# Patient Record
Sex: Female | Born: 1965
Health system: Southern US, Community
[De-identification: ages and names within clinical notes are randomized; demographics above are authoritative.]

## PROBLEM LIST (undated history)

## (undated) DIAGNOSIS — R011 Cardiac murmur, unspecified: Secondary | ICD-10-CM

## (undated) DIAGNOSIS — T7840XA Allergy, unspecified, initial encounter: Secondary | ICD-10-CM

## (undated) HISTORY — PX: OTHER SURGICAL HISTORY: SHX169

## (undated) HISTORY — DX: Cardiac murmur, unspecified: R01.1

## (undated) HISTORY — PX: WISDOM TOOTH EXTRACTION: SHX21

## (undated) HISTORY — DX: Allergy, unspecified, initial encounter: T78.40XA

---

## 2011-06-04 ENCOUNTER — Encounter: Payer: Self-pay | Admitting: Family Medicine

## 2011-06-04 ENCOUNTER — Ambulatory Visit (INDEPENDENT_AMBULATORY_CARE_PROVIDER_SITE_OTHER): Payer: 59 | Admitting: Family Medicine

## 2011-06-04 VITALS — BP 123/80 | HR 61 | Temp 97.9°F | Ht 67.0 in | Wt 125.0 lb

## 2011-06-04 DIAGNOSIS — M79609 Pain in unspecified limb: Secondary | ICD-10-CM

## 2011-06-04 DIAGNOSIS — M79673 Pain in unspecified foot: Secondary | ICD-10-CM

## 2011-06-04 NOTE — Patient Instructions (Signed)
Your ultrasound is negative for the usual signs of a calcaneal stress fracture (bony irregularity, blood flow and swelling on top of bone surface). This is most consistent with plantar fasciitis Take tylenol or aleve as needed for pain  Plantar fascia stretch for 20-30 seconds (do 3 of these) in morning Lowering/raise on a step exercises 3 x 15 once or twice a day - this is very important for long term recovery. Can add heel walks, toe walks forward and backward as well Ice bucket 10-15 minutes at end of day Avoid flat shoes/barefoot walking as much as possible. Arch straps have been shown to help with pain. Consider heel lifts to avoid fully stretching the plantar fascia if painful with walking. Over the counter inserts may be helpful as well. Steroid injection is a consideration for short term pain relief if you are struggling. Physical therapy is also an option. >90% improve by 12 months with or without treatment but the above things improve the condition faster. No running if pain is > 3 on scale of 1-10 or if you're limping. Cross train for at least next 2 weeks. When going back to running do a walk:jog program 1 minute: 1 minute for total of 10 minutes and do this every other day.  Increase jog time by 1-2 minutes each session and total workout time by 5 minutes. Follow up with me in 6 weeks for a recheck.

## 2011-06-05 ENCOUNTER — Encounter: Payer: Self-pay | Admitting: Family Medicine

## 2011-06-05 DIAGNOSIS — M79673 Pain in unspecified foot: Secondary | ICD-10-CM | POA: Insufficient documentation

## 2011-06-05 NOTE — Assessment & Plan Note (Signed)
Ultrasound is reassuring as well as initial evolution of her pain, negative calcaneal squeeze, and location of tenderness (at PF insertion).  Believe this represents plantar fasciitis though discussed is possible she has a stress reaction.  Discussed relative rest.  Shown home stretches/exercises.  Arch strap provided.  Icing, tylenol/motrin as needed.  No running for 2 weeks then can trial walk/jog program - no running if limping or pain > 3/10.  Cross train in meantime.  See instructions for further.

## 2011-06-05 NOTE — Progress Notes (Signed)
  Subjective:    Patient ID: Marie Avila, female    DOB: 11-13-1965, 45 y.o.   MRN: [id number]  PCP: None  HPI 45 yo F here for right heel pain.  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 States approximately 2 months ago started to develop plantar right heel pain after her runs. Also noted pain in the morning with first steps. Did not limit her running initially. Started to notice pain toward end of runs and now has pain throughout runs. No pain at rest or with walking currently. Has been doing massage, icing. No bruising or swelling. Has not done any specific rehab exercises for this. Feels it when she does a calf stretch. Took a week off then went back to running and pain was still present. No prior stress fractures.  History reviewed. No pertinent past medical history.  No current outpatient prescriptions on file prior to visit.    History reviewed. No pertinent past surgical history.  Allergies  Allergen Reactions  . Penicillins     History   Social History  . Marital Status: Single    Spouse Name: N/A    Number of Children: N/A  . Years of Education: N/A   Occupational History  . Not on file.   Social History Main Topics  . Smoking status: Never Smoker   . Smokeless tobacco: Not on file  . Alcohol Use: Not on file  . Drug Use: Not on file  . Sexually Active: Not on file   Other Topics Concern  . Not on file   Social History Narrative  . No narrative on file    Family History  Problem Relation Age of Onset  . Hypertension Maternal Grandfather   . Sudden death Neg Hx   . Hyperlipidemia Neg Hx   . Heart attack Neg Hx   . Diabetes Neg Hx     BP 123/80  Pulse 61  Temp(Src) 97.9 F (36.6 C) (Oral)  Ht 5\' 7"  (1.702 m)  Wt 125 lb (56.7 kg)  BMI 19.58 kg/m2  Review of Systems See HPI above.    Objective:   Physical Exam Gen: NAD  R foot/ankle: No  gross deformity, swelling, bruising. Mild overpronation. TTP medial anterior plantar calcaneus at PF insertion.  Mild TTP mid-calcaneus as well.  No achilles or other TTP about foot or ankle. FROM with 5/5 strength each direction. Negative thompsons Negative ant drawer, talar tilt. Negative calcaneal squeeze.  MSK u/s: Mild thickening and edema of plantar fascia.  AT appears normal.  No evidence of calcaneal stress fracture - no bony irregularities, edema overlying bony cortex plantar/post/med/lat calcaneus, or neovascularity at bony surface.    Assessment & Plan:  1. Right heel pain - Ultrasound is reassuring as well as initial evolution of her pain, negative calcaneal squeeze, and location of tenderness (at PF insertion).  Believe this represents plantar fasciitis though discussed is possible she has a stress reaction.  Discussed relative rest.  Shown home stretches/exercises.  Arch strap provided.  Icing, tylenol/motrin as needed.  No running for 2 weeks then can trial walk/jog program - no running if limping or pain > 3/10.  Cross train in meantime.  See instructions for further.

## 2011-10-16 LAB — HM PAP SMEAR: HM Pap smear: NEGATIVE

## 2011-11-22 ENCOUNTER — Encounter: Payer: Self-pay | Admitting: Internal Medicine

## 2011-11-22 DIAGNOSIS — Z0000 Encounter for general adult medical examination without abnormal findings: Secondary | ICD-10-CM | POA: Insufficient documentation

## 2011-11-25 ENCOUNTER — Other Ambulatory Visit (INDEPENDENT_AMBULATORY_CARE_PROVIDER_SITE_OTHER): Payer: 59

## 2011-11-25 ENCOUNTER — Ambulatory Visit (INDEPENDENT_AMBULATORY_CARE_PROVIDER_SITE_OTHER): Payer: 59 | Admitting: Internal Medicine

## 2011-11-25 ENCOUNTER — Encounter: Payer: Self-pay | Admitting: Internal Medicine

## 2011-11-25 VITALS — BP 102/62 | HR 69 | Temp 98.8°F | Ht 67.0 in | Wt 120.1 lb

## 2011-11-25 DIAGNOSIS — Z0000 Encounter for general adult medical examination without abnormal findings: Secondary | ICD-10-CM

## 2011-11-25 LAB — CBC WITH DIFFERENTIAL/PLATELET
Basophils Relative: 0.9 % (ref 0.0–3.0)
Eosinophils Absolute: 0 10*3/uL (ref 0.0–0.7)
Eosinophils Relative: 0.4 % (ref 0.0–5.0)
Hemoglobin: 12.8 g/dL (ref 12.0–15.0)
Lymphocytes Relative: 31.7 % (ref 12.0–46.0)
MCHC: 33.9 g/dL (ref 30.0–36.0)
MCV: 96.5 fl (ref 78.0–100.0)
Neutro Abs: 4.4 10*3/uL (ref 1.4–7.7)
RBC: 3.93 Mil/uL (ref 3.87–5.11)
WBC: 7.3 10*3/uL (ref 4.5–10.5)

## 2011-11-25 LAB — BASIC METABOLIC PANEL
Calcium: 8.8 mg/dL (ref 8.4–10.5)
Creatinine, Ser: 1 mg/dL (ref 0.4–1.2)

## 2011-11-25 LAB — URINALYSIS, ROUTINE W REFLEX MICROSCOPIC
Hgb urine dipstick: NEGATIVE
Nitrite: NEGATIVE
Specific Gravity, Urine: <=1.005 (ref 1.000–1.030)
Total Protein, Urine: NEGATIVE
Urine Glucose: NEGATIVE
pH: 7 (ref 5.0–8.0)

## 2011-11-25 LAB — LIPID PANEL
Cholesterol: 204 mg/dL — ABNORMAL HIGH (ref 0–200)
Total CHOL/HDL Ratio: 2
Triglycerides: 54 mg/dL (ref 0.0–149.0)
VLDL: 10.8 mg/dL (ref 0.0–40.0)

## 2011-11-25 LAB — HEPATIC FUNCTION PANEL
Bilirubin, Direct: 0.1 mg/dL (ref 0.0–0.3)
Total Bilirubin: 0.5 mg/dL (ref 0.3–1.2)
Total Protein: 6.5 g/dL (ref 6.0–8.3)

## 2011-11-25 MED ORDER — TETANUS-DIPHTH-ACELL PERTUSSIS 5-2.5-18.5 LF-MCG/0.5 IM SUSP: 0.5000 mL | Freq: Once | INTRAMUSCULAR | Status: DC

## 2011-11-25 NOTE — Patient Instructions (Signed)
You had the tetanus shot today Please go to LAB in the Basement for the blood and/or urine tests to be done today You will be contacted by phone if any changes need to be made immediately.  Otherwise, you will receive a letter about your results with an explanation. Please remember to followup with your GYN for the yearly pap smear and/or mammogram as you do Please return in 1 year for your yearly visit, or sooner if needed, with Lab testing done 3-5 days before

## 2011-11-25 NOTE — Assessment & Plan Note (Signed)

## 2011-11-25 NOTE — Progress Notes (Signed)
Subjective:    Patient ID: Marie Avila, female    DOB: 10/01/1965, 46 y.o.   MRN: [id number]  HPI  Here for wellness and f/u;  Overall doing ok;  Pt denies CP, worsening SOB, DOE, wheezing, orthopnea, PND, worsening LE edema, palpitations, dizziness or syncope.  Pt denies neurological change such as new Headache, facial or extremity weakness.  Pt denies polydipsia, polyuria, or low sugar symptoms. Pt states overall good compliance with treatment and medications, good tolerability, and trying to follow lower cholesterol diet.  Pt denies worsening depressive symptoms, suicidal ideation or panic. No fever, wt loss, night sweats, loss of appetite, or other constitutional symptoms.  Pt states good ability with ADL's, low fall risk, home safety reviewed and adequate, no significant changes in hearing or vision, and occasionally active with exercise.  No complaints today History reviewed. No pertinent past medical history. History reviewed. No pertinent past surgical history.  reports that she has never smoked. She does not have any smokeless tobacco history on file. She reports that she drinks alcohol. She reports that she does not use illicit drugs. family history includes Cancer in her father and Hypertension in her maternal grandfather.  There is no history of Sudden death, and Hyperlipidemia, and Heart attack, and Diabetes, . Allergies  Allergen Reactions  . Penicillins    Current Outpatient Prescriptions on File Prior to Visit  Medication Sig Dispense Refill  . Norgestim-Eth Estrad Triphasic (TRINESSA, 28, PO) Take by mouth.       No current facility-administered medications on file prior to visit.   Review of Systems Review of Systems  Constitutional: Negative for diaphoresis, activity change, appetite change and unexpected weight change.  HENT: Negative for hearing loss, ear pain, facial swelling, mouth sores and neck stiffness.   Eyes: Negative for pain, redness and visual disturbance.    Respiratory: Negative for shortness of breath and wheezing.   Cardiovascular: Negative for chest pain and palpitations.  Gastrointestinal: Negative for diarrhea, blood in stool, abdominal distention and rectal pain.  Genitourinary: Negative for hematuria, flank pain and decreased urine volume.  Musculoskeletal: Negative for myalgias and joint swelling.  Skin: Negative for color change and wound.  Neurological: Negative for syncope and numbness.  Hematological: Negative for adenopathy.  Psychiatric/Behavioral: Negative for hallucinations, self-injury, decreased concentration and agitation.      Objective:   Physical Exam BP 102/62  Pulse 69  Temp(Src) 98.8 F (37.1 C) (Oral)  Ht 5\' 7"  (1.702 m)  Wt 120 lb 2 oz (54.488 kg)  BMI 18.81 kg/m2  SpO2 99% Physical Exam  VS noted Constitutional: Pt is oriented to person, place, and time. Appears well-developed and well-nourished.  HENT:  Head: Normocephalic and atraumatic.  Right Ear: External ear normal.  Left Ear: External ear normal.  Nose: Nose normal.  Mouth/Throat: Oropharynx is clear and moist.  Eyes: Conjunctivae and EOM are normal. Pupils are equal, round, and reactive to light.  Neck: Normal range of motion. Neck supple. No JVD present. No tracheal deviation present.  Cardiovascular: Normal rate, regular rhythm, normal heart sounds and intact distal pulses.   Pulmonary/Chest: Effort normal and breath sounds normal.  Abdominal: Soft. Bowel sounds are normal. There is no tenderness.  Musculoskeletal: Normal range of motion. Exhibits no edema.  Lymphadenopathy:  Has no cervical adenopathy.  Neurological: Pt is alert and oriented to person, place, and time. Pt has normal reflexes. No cranial nerve deficit.  Skin: Skin is warm and dry. No rash noted.  Psychiatric:  Has  normal mood and affect. Behavior is normal.     Assessment & Plan:

## 2011-11-30 ENCOUNTER — Encounter: Payer: Self-pay | Admitting: Internal Medicine

## 2012-10-20 ENCOUNTER — Ambulatory Visit (INDEPENDENT_AMBULATORY_CARE_PROVIDER_SITE_OTHER): Payer: Medicare HMO | Admitting: Family Medicine

## 2012-10-20 ENCOUNTER — Encounter: Payer: Self-pay | Admitting: Family Medicine

## 2012-10-20 VITALS — BP 122/81 | HR 52 | Ht 67.0 in | Wt 123.0 lb

## 2012-10-20 DIAGNOSIS — M25562 Pain in left knee: Secondary | ICD-10-CM

## 2012-10-20 DIAGNOSIS — M25569 Pain in unspecified knee: Secondary | ICD-10-CM

## 2012-10-21 ENCOUNTER — Encounter: Payer: Self-pay | Admitting: Family Medicine

## 2012-10-21 DIAGNOSIS — M25562 Pain in left knee: Secondary | ICD-10-CM | POA: Insufficient documentation

## 2012-10-21 NOTE — Patient Instructions (Addendum)
Patellofemoral instructions, rehab given

## 2012-10-21 NOTE — Assessment & Plan Note (Signed)
consistent with patellofemoral syndrome based on history, exam benign other than overpronation and VMO atrophy.  Discussed mild arthritis another possibility.  Start with home exercise program which was demonstrated today.  Icing, relative rest - discussed going into marathon as health as possible.  If knee swells up, pain worsens despite rehab, would like to add radiographs that she should call me.  Otherwise f/u in 6 weeks or just before her race.

## 2012-10-21 NOTE — Progress Notes (Signed)
Subjective:    Patient ID: Marie Avila, female    DOB: 28-May-1966, 47 y.o.   MRN: [id number]  PCP: Dr. Jonny Ruiz  HPI 47 yo F here for left knee pain.  Patient denies known injury. Training for [employer] that is in about 5 weeks. States initially had pain in the fall but she took a month off - pain at that time was more lateral, thought it was IT band because she had this in right knee before. Pain had resolved. Then over the past few weeks has noticed some anterior deep pain (deep to kneecap) that started about a week after her 18 mile run. Has also been more active helping her mother move boxes. Took an entire 1 1/2 weeks off and still having pain here. No tenderness when she presses on the knee. No pain with stairs or certain movements. No prior history of stress fracture.  History reviewed. No pertinent past medical history.  Current Outpatient Prescriptions on File Prior to Visit  Medication Sig Dispense Refill  . glucosamine-chondroitin 500-400 MG tablet Take 1 tablet by mouth 3 (three) times daily.      . Multiple Vitamin (MULTIVITAMIN) tablet Take 1 tablet by mouth daily.      Lorita Officer Triphasic (TRINESSA, 46, PO) Take by mouth.       Current Facility-Administered Medications on File Prior to Visit  Medication Dose Route Frequency Provider Last Rate Last Dose  . TDaP (BOOSTRIX) injection 0.5 mL  0.5 mL Intramuscular Once Corwin Levins, MD        History reviewed. No pertinent past surgical history.  Allergies  Allergen Reactions  . Penicillins     History   Social History  . Marital Status: Single    Spouse Name: N/A    Number of Children: N/A  . Years of Education: 16   Occupational History  . RN [employer]   Social History Main Topics  . Smoking status: Never Smoker   . Smokeless tobacco: Not on file  . Alcohol Use: Yes     Comment: social  . Drug Use: No  . Sexually Active: Not on file   Other Topics Concern  . Not on file    Social History Narrative  . No narrative on file    Family History  Problem Relation Age of Onset  . Hypertension Maternal Grandfather   . Sudden death Neg Hx   . Hyperlipidemia Neg Hx   . Heart attack Neg Hx   . Diabetes Neg Hx   . Cancer Father     BP 122/81  Pulse 52  Ht 5\' 7"  (1.702 m)  Wt 123 lb (55.792 kg)  BMI 19.26 kg/m2  Review of Systems See HPI above.    Objective:   Physical Exam Gen: NAD  L knee: No gross deformity, ecchymoses, swelling.  VMO atrophy.  No palpable plicae. No TTP joint lines, post patellar facets, elsewhere about knee. FROM without pain. Negative ant/post drawers. Negative valgus/varus testing. Negative lachmanns. Negative mcmurrays, apleys, patellar apprehension, clarkes. Hypermobile patellae. NV intact distally. Hip strength 5/5 with abduction.   Leg lengths equal. SI joints with normal flexibility and no pain. Other strength 5/5 lower extremities. Collapsed long arches bilaterally.    Assessment & Plan:  1. Left knee pain - consistent with patellofemoral syndrome based on history, exam benign other than overpronation and VMO atrophy.  Discussed mild arthritis another possibility.  Start with home exercise program which was demonstrated today.  Icing, relative  rest - discussed going into marathon as health as possible.  If knee swells up, pain worsens despite rehab, would like to add radiographs that she should call me.  Otherwise f/u in 6 weeks or just before her race.

## 2012-11-09 ENCOUNTER — Ambulatory Visit (INDEPENDENT_AMBULATORY_CARE_PROVIDER_SITE_OTHER): Payer: Medicare HMO | Admitting: Sports Medicine

## 2012-11-09 VITALS — BP 130/82 | Ht 67.0 in | Wt 120.0 lb

## 2012-11-09 DIAGNOSIS — M216X9 Other acquired deformities of unspecified foot: Secondary | ICD-10-CM

## 2012-11-09 DIAGNOSIS — M25562 Pain in left knee: Secondary | ICD-10-CM

## 2012-11-09 DIAGNOSIS — M25569 Pain in unspecified knee: Secondary | ICD-10-CM

## 2012-11-09 NOTE — Progress Notes (Signed)
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  Pain started approximately 5-6 months ago during a marathon.  She was running at mile 18 and began having left lateral knee pain.  She continued to run, however, was forced to walk for some of the remaining 8 miles.  She took 3 weeks off post race which somewhat helped the pain, however, upon initiating running again she developed 5/10 aching pain locating "under the kneecap."  Again, she took time off and the pain did not go away.  Pain is always there during runs recently, worse during running on level ground and better with inclines/declines.  Denies any hip pain, foot pain, locking, giving way of the knee, or any particular trauma.  No paresthesias or numbness into the left foot.    Objective: BP 130/82  Ht 5\' 7"  (1.702 m)  Wt 120 lb (54.432 kg)  BMI 18.79 kg/m2 Gen:  NAD, well appearing, athletic.  Skin:  Well perfused, warm and dry.  Cap refill < 2 seconds. Psych:  Alert and oriented x 3.   Msk:  Bilateral knees in neutral alignment.  Bilateral feet with slight hindfoot valgus deformities.  Tender to palpation along the vastus lateralis insertion onto the superior lateral pole of the patella.  No palpable effusion.  Full active and passive ROM at the knee.  -Lachman's, - McMurray's, - Thessaly, -varus/valgus stress, - patellar apprehension, + patellar grind, + Noble's.    Gait evaluation reveals midfoot striker bilaterally with no evidence of overpronation of the feet.    Limited left knee ultrasound in both transverse and longitudinal views revealed focal hypoechogenicity located in the vastus lateralis tendon as it inserts into the patella.  No evidence of tear/rupture or hyperemia on doppler.    Assessment: 47 yo female with chronic left suprapatellar pain with ultrasound evidence of possible micro tears along the vastus lateralis tendon insertion.   Plan:  1.  Vastus  lateralis tendonopathy   --Counseled and educated patient regarding her condition and prognosis.  She was extremely relieved she did not have arthritis.  She was given instructions and taught how to perform home isometric straight leg raises with toes up, out and in.  She is to perform 3 sets of 15 and add ankle weights if this becomes too easy.  I will plan to see her back in 3-4 following her [facility]. He will eventually need eccentric strengthening exercises and we could continue consider the possibility of topical nitroglycerin. We will repeat her ultrasound at her followup visit.    2.  Hindfoot valgus   --Discussed this finding with patient.  Given her [place] marathon in over 1 week we will not change biomechanics at this time. However, I plan to see her back in 3-4 weeks for possible arch support to correct deformity.    Stephanie Coup. Arvilla Market, DO

## 2012-12-06 ENCOUNTER — Encounter: Payer: Self-pay | Admitting: *Deleted

## 2012-12-06 ENCOUNTER — Ambulatory Visit: Payer: Medicare HMO | Admitting: Sports Medicine

## 2012-12-07 ENCOUNTER — Ambulatory Visit (INDEPENDENT_AMBULATORY_CARE_PROVIDER_SITE_OTHER): Payer: Managed Care, Other (non HMO) | Admitting: Nurse Practitioner

## 2012-12-07 ENCOUNTER — Encounter: Payer: Self-pay | Admitting: Nurse Practitioner

## 2012-12-07 VITALS — BP 102/60 | HR 68 | Resp 16 | Ht 67.75 in | Wt 120.0 lb

## 2012-12-07 DIAGNOSIS — Z01419 Encounter for gynecological examination (general) (routine) without abnormal findings: Secondary | ICD-10-CM

## 2012-12-07 MED ORDER — NORGESTIM-ETH ESTRAD TRIPHASIC 0.18/0.215/0.25 MG-35 MCG PO TABS: 1.0000 | ORAL_TABLET | Freq: Every day | ORAL | Status: DC

## 2012-12-07 NOTE — Progress Notes (Signed)
Encounter reviewed by Dr. Jaquelin Meaney Silva.  

## 2012-12-07 NOTE — Progress Notes (Signed)
47 y.o. G0P0 Single Caucasian Fe here for annual exam.  Menses lasting 3 days and very light.  Some cramps, help with OTC NSAID'S.  Feels well.  Now working with [employer] since December and likes the hours.  No LMP recorded. Patient is not currently having periods (Reason: Oral contraceptives).   States menses is due on Sunday        Sexually active:Yes yes  The current method of family planning is Oral contraceptive pill (estrogen/progesterone).    Exercising:Yes yes Running running Smoker: No Health Maintenance: Pap:10/16/11 normal with negative HR HPV MMG: None Colonoscopy: not indicated BMD:   Not indicated TDaP:  11/25/11 Labs: done at PCP 11/25/11   reports that she has never smoked. She has never used smokeless tobacco. She reports that she drinks about 0.5 ounces of alcohol per week. She reports that she does not use illicit drugs.  No past medical history on file.  No past surgical history on file.  Current Outpatient Prescriptions  Medication Sig Dispense Refill  . Multiple Vitamin (MULTIVITAMIN) tablet Take 1 tablet by mouth daily.      Lorita Officer Triphasic (TRINESSA, 28, PO) Take by mouth.       Current Facility-Administered Medications  Medication Dose Route Frequency Provider Last Rate Last Dose  . TDaP (BOOSTRIX) injection 0.5 mL  0.5 mL Intramuscular Once Corwin Levins, MD        Family History  Problem Relation Age of Onset  . Hypertension Maternal Grandfather   . Sudden death Neg Hx   . Hyperlipidemia Neg Hx   . Heart attack Neg Hx   . Diabetes Neg Hx   . Cancer Father     ROS:  Pertinent items are noted in HPI.  Otherwise, a comprehensive ROS was negative.  Exam:   BP 102/60  Pulse 68  Resp 16  Ht 5' 7.75" (1.721 m)  Wt 120 lb (54.432 kg)  BMI 18.38 kg/m2  Weight change: Height: 5' 7.75" (172.1 cm)  Ht Readings from Last 3 Encounters:  12/07/12 5' 7.75" (1.721 m)  11/09/12 5\' 7"  (1.702 m)  10/20/12 5\' 7"  (1.702 m)    General  appearance: alert, cooperative and appears stated age Head: Normocephalic, without obvious abnormality, atraumatic Neck: no adenopathy, supple, symmetrical, trachea midline and thyroid normal to inspection and palpation Lungs: clear to auscultation bilaterally Breasts: normal appearance, no masses or tenderness Heart: regular rate and rhythm Abdomen: soft, non-tender; no masses,  no organomegaly Extremities: extremities normal, atraumatic, no cyanosis or edema Skin: Skin color, texture, turgor normal. No rashes or lesions Lymph nodes: Cervical, supraclavicular, and axillary nodes normal. No abnormal inguinal nodes palpated Neurologic: Grossly normal   Pelvic: External genitalia:  no lesions              Urethra:  normal appearing urethra with no masses, tenderness or lesions              Bartholin's and Skene's: normal                 Vagina: normal appearing vagina with normal color and discharge, no lesions              Cervix: anteverted              Pap taken: no Bimanual Exam:  Uterus:  normal size, contour, position, consistency, mobility, non-tender              Adnexa: no mass, fullness, tenderness  Rectovaginal: Confirms               Anus:  normal sphincter tone, no lesions  A:  Well Woman with normal exam  Contraception  P:   Pap smear as per guidelines  RF OCP   mammogram  counseled on breast self exam, mammography screening,   adequate intake of calcium and vitamin D, diet and exercise  return annually or prn  An After Visit Summary was printed and given to the patient.

## 2012-12-07 NOTE — Patient Instructions (Addendum)

## 2012-12-13 ENCOUNTER — Encounter: Payer: Self-pay | Admitting: Sports Medicine

## 2012-12-13 ENCOUNTER — Ambulatory Visit (INDEPENDENT_AMBULATORY_CARE_PROVIDER_SITE_OTHER): Payer: Medicare HMO | Admitting: Sports Medicine

## 2012-12-13 VITALS — BP 111/73 | HR 48 | Ht 67.0 in | Wt 120.0 lb

## 2012-12-13 DIAGNOSIS — M25562 Pain in left knee: Secondary | ICD-10-CM

## 2012-12-13 DIAGNOSIS — M25569 Pain in unspecified knee: Secondary | ICD-10-CM

## 2012-12-13 MED ORDER — NITROGLYCERIN 0.2 MG/HR TD PT24: MEDICATED_PATCH | TRANSDERMAL | Status: DC

## 2012-12-13 NOTE — Progress Notes (Signed)
  Subjective:    Patient ID: Marie Avila, female    DOB: [birth year], 47 y.o.   MRN: [id number]  HPI Patient comes in today for followup on left knee pain. She was able to run in the [facility]. She began to experience returning knee pain at mild 20 but was able to finish the race. Pain persisted for couple days afterwards been improved. She has not done any running in the past couple of weeks so she is pain-free today. She has been doing her isometric exercises. She also has a pair of good inserts for her running shoes which does improve her hindfoot valgus.    Review of Systems     Objective:   Physical Exam Well-developed, well-nourished. No acute distress. Awake alert and oriented x3. Vital signs are reviewed  Left knee: Full range of motion. No effusion. Negative patellar compression. No tenderness to palpation around the quadriceps tendon. No soft tissue swelling. Knee remains stable to ligamentous exam. Neurovascular intact distally. Walking without a limp.  She has excellent running form. She is a Product manager.  MSK ultrasound of the left knee: Both long and short images of the quadriceps tendon were obtained. There is still a focal area of hypoechogenicity in the vastus lateralis tendon just proximal to the patellar insertion. Remainder of the tendon is unremarkable.       Assessment & Plan:  1. Left knee pain secondary to vastus lateralis tendinopathy  Patient will start the topical nitroglycerin protocol applying a quarter patch daily. She will add eccentric exercises to her isometric exercises. She will wait one more week before resuming her running. Followup with me in 4 weeks and we will repeat her ultrasound. I think the inserts that she has in her shoes are adequate.

## 2012-12-13 NOTE — Patient Instructions (Addendum)

## 2013-01-17 ENCOUNTER — Ambulatory Visit (INDEPENDENT_AMBULATORY_CARE_PROVIDER_SITE_OTHER): Payer: Medicare HMO | Admitting: Sports Medicine

## 2013-01-17 ENCOUNTER — Ambulatory Visit
Admission: RE | Admit: 2013-01-17 | Discharge: 2013-01-17 | Disposition: A | Discharge status: Home / Self Care | Payer: Medicare HMO | Source: Ambulatory Visit | Attending: Sports Medicine | Admitting: Sports Medicine

## 2013-01-17 VITALS — BP 113/80 | Ht 68.0 in | Wt 120.0 lb

## 2013-01-17 DIAGNOSIS — M25569 Pain in unspecified knee: Secondary | ICD-10-CM

## 2013-01-17 DIAGNOSIS — M25562 Pain in left knee: Secondary | ICD-10-CM

## 2013-01-18 NOTE — Progress Notes (Addendum)
  Subjective:    Patient ID: Marie Avila, female    DOB: 01/16/1966, 47 y.o.   MRN: [id number]  HPI Hunter comes in today for followup on left knee pain. Prior ultrasounds have shown a small partial tear to the distal vastus lateralis tendon. She was unable to tolerate the nitroglycerin do to dizziness. She stopped it after 2 days but she has been diligent about doing her eccentric exercises. Despite this she has been unable to return to running without discomfort. She gets lateral knee pain within the first mile. It is not debilitating and it is not associated with any swelling but she does get a feeling of stiffness in the knee. She feels like she can run through it but is concerned about doing more damage. No locking or catching of the knee.  Interim medical history is unchanged    Review of Systems     Objective:   Physical Exam Well-developed, fit-appearing. No acute distress  Left knee: Full range of motion. No effusion. No tenderness to palpation along the quadriceps tendon. No soft tissue swelling. No pain with patellar compression. Knee remained stable to ligamentous exam. No joint line tenderness. Neurovascularly intact distally. Walking without a limp.  MSK ultrasound of the left knee: A limited scan of the anterior knee still shows a small hypoechoic area at the insertion of the vastus lateralis tendon. Does not appear to be as large as on prior scans.  X-rays including AP, lateral, tunnel view, and sunrise view are unremarkable. No significant degenerative changes and no evidence of OCD.      Assessment & Plan:  1. Persistent left knee pain with ultrasound evidence of small partial vastus lateralis tendon tear  Although the ultrasound does show evidence of a small partial vastus lateralis tendon tear, Bhavana is not improving the way I would expect. However, her symptoms are not debilitating and she would like to return to running. Her x-rays are unremarkable but I explained to  her that there is the possibility of intra-articular knee pathology such as an occult OCD which the ultrasound may not pick up. Our plan at this time is to allow Jniyah to increase her running as long as her pain is a 3/10 or less. She has a compression sleeve to wear as well. I will see her in followup in 4 weeks. She will also continue with her eccentric exercises. If she is unable to return to running in the interim and she is instructed to call me at which point I would consider further diagnostic imaging of this left knee.

## 2013-02-16 ENCOUNTER — Ambulatory Visit (INDEPENDENT_AMBULATORY_CARE_PROVIDER_SITE_OTHER): Payer: Medicare HMO | Admitting: Sports Medicine

## 2013-02-16 VITALS — BP 123/86 | Ht 68.0 in | Wt 120.0 lb

## 2013-02-16 DIAGNOSIS — M25562 Pain in left knee: Secondary | ICD-10-CM

## 2013-02-16 DIAGNOSIS — M25569 Pain in unspecified knee: Secondary | ICD-10-CM

## 2013-02-16 NOTE — Progress Notes (Signed)
  Subjective:    Patient ID: Marie Avila, female    DOB: 1965/10/31, 47 y.o.   MRN: [id number]  HPI Marie Avila comes in today for followup on her left knee. She is doing well. She has been able to return to running 6 miles 3-4 days a week. She still feels a little "something" along the lateral distal quad tendon but it is not painful it is not keeping her from enjoying her running. She denies swelling. No mechanical symptoms. She has been using a compression sleeve and is found to be very beneficial. She has been faithful with doing her home exercises including eccentric decline squats, hip strengthening, and overall knee strengthening.    Review of Systems     Objective:   Physical Exam Well-developed, fit-appearing. No acute distress  Left knee: Full range of motion. No effusion. No tenderness to palpation along the distal quadriceps tendon, specifically over the area of the vastus lateralis insertion onto the patella. Good strength. Good stability. Neurovascularly intact distally. Walking without a limp.  MSK ultrasound of the left knee: Limited views in long and short axis were obtained of the quadriceps tendon. The area of partial tearing seen along the vastus lateralis tendon on previous scan is no longer seen. There is a small area of calcification in this area but it is quite small.       Assessment & Plan:  1. Improved left knee pain secondary to partial vastus lateralis tendon tear  Today's ultrasound shows no evidence of tearing. She may continue to increase her running as tolerated. Continue with comprehensive home exercise program. No need to pursue further diagnostic imaging at this time since she is doing much better. Followup when necessary.

## 2013-04-20 ENCOUNTER — Ambulatory Visit: Payer: Medicare HMO | Admitting: Sports Medicine

## 2013-05-20 ENCOUNTER — Ambulatory Visit (INDEPENDENT_AMBULATORY_CARE_PROVIDER_SITE_OTHER): Payer: Medicare HMO | Admitting: Internal Medicine

## 2013-05-20 ENCOUNTER — Encounter: Payer: Self-pay | Admitting: Internal Medicine

## 2013-05-20 VITALS — BP 96/62 | HR 58 | Temp 97.9°F | Ht 67.0 in | Wt 121.0 lb

## 2013-05-20 DIAGNOSIS — M25551 Pain in right hip: Secondary | ICD-10-CM | POA: Insufficient documentation

## 2013-05-20 DIAGNOSIS — R5381 Other malaise: Secondary | ICD-10-CM

## 2013-05-20 DIAGNOSIS — M25559 Pain in unspecified hip: Secondary | ICD-10-CM

## 2013-05-20 NOTE — Progress Notes (Signed)
Subjective:    Patient ID: Marie Avila, female    DOB: [birth year], 47 y.o.   MRN: [id number]  HPI  Here to f/u, c/o unusual fatigue and feeling overall less well but hard to be more specific;  Has experienced left knee pain approx 1 mo ago now improved, and then with onset right hip pain more recent, mild but persistent constant, no better despite not running for the last month.  Cannot get back to running now due to the right hip pain.  Has been running approx 30 miles per wk.  Pt denies chest pain, increased sob or doe, wheezing, orthopnea, PND, increased LE swelling, palpitations, dizziness or syncope.  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   Feels somewhat puffy like retaining fluid as well?  Denies worsening reflux, abd pain, dysphagia, n/v, bowel change or blood, except for occasional bloating.    On BCP, was having perimenopausal symptoms, not heavy menses. No other overt  Blood loss. Sleeping a lot. Gained approx 5 lbs .  O/w Denies hyper or hypo thyroid symptoms such as voice, skin or hair change. No past medical history on file. No past surgical history on file.  reports that she has never smoked. She has never used smokeless tobacco. She reports that she drinks about 0.5 ounces of alcohol per week. She reports that she does not use illicit drugs. family history includes Cancer in her father; Heart disease in her maternal grandfather; Hypertension in her maternal grandfather; Other in her maternal grandmother, mother, paternal grandfather, and paternal grandmother; Stroke in her maternal grandmother. There is no history of Sudden death, Hyperlipidemia, Heart attack, or  Diabetes. Allergies  Allergen Reactions  . Penicillins    Current Outpatient Prescriptions on File Prior to Visit  Medication Sig Dispense Refill  . Multiple Vitamin (MULTIVITAMIN) tablet Take 1 tablet by mouth daily.      . nitroGLYCERIN (NITRODUR - DOSED IN MG/24 HR) 0.2 mg/hr Apply 1/4 to affected area daily.  Change patch every 24 hours.  30 patch  1  . Norgestimate-Ethinyl Estradiol Triphasic (TRINESSA, 28,) 0.18/0.215/0.25 MG-35 MCG tablet Take 1 tablet by mouth daily.  3 Package  3   Current Facility-Administered Medications on File Prior to Visit  Medication Dose Route Frequency Provider Last Rate Last Dose  . TDaP (BOOSTRIX) injection 0.5 mL  0.5 mL Intramuscular Once Corwin Levins, MD        Review of Systems  Constitutional: Negative for unexpected weight change, or unusual diaphoresis  HENT: Negative for tinnitus.   Eyes: Negative for photophobia and visual disturbance.  Respiratory: Negative for choking and stridor.   Gastrointestinal: Negative for vomiting and blood in stool.  Genitourinary: Negative for hematuria and decreased urine volume.  Musculoskeletal: Negative for acute joint swelling Skin: Negative for color change and wound.  Neurological: Negative for tremors and numbness other than noted  Psychiatric/Behavioral: Negative for decreased concentration or  hyperactivity.       Objective:   Physical Exam BP 96/62  Pulse 58  Temp(Src) 97.9 F (36.6 C) (Oral)  Ht 5\' 7"  (1.702 m)  Wt 121 lb (54.885 kg)  BMI 18.95 kg/m2  SpO2 96% VS noted,  Constitutional: Pt appears  well-developed and well-nourished.  HENT: Head: NCAT.  Right Ear: External ear normal.  Left Ear: External ear normal.  Eyes: Conjunctivae and EOM are normal. Pupils are equal, round, and reactive to light.  Neck: Normal range of motion. Neck supple.  Cardiovascular: Normal rate and regular rhythm.   Pulmonary/Chest: Effort normal and breath sounds normal.  Abd:  Soft, NT, non-distended, +  BS Neurological: Pt is alert. Not confused  Skin: Skin is warm. No erythema.  Psychiatric: Pt behavior is normal. Thought content normal. mild nervous    Assessment & Plan:

## 2013-05-20 NOTE — Patient Instructions (Signed)
Please continue all other medications as before Please have the pharmacy call with any other refills you may need.  You will be contacted regarding the referral for: Dr Katrinka Blazing, sports medicine  Please go to the LAB in the Basement (turn left off the elevator) for the tests to be done today You will be contacted by phone if any changes need to be made immediately.  Otherwise, you will receive a letter about your results with an explanation, but please check with MyChart first.  Please remember to sign up for My Chart if you have not done so, as this will be important to you in the future with finding out test results, communicating by private email, and scheduling acute appointments online when needed.

## 2013-05-21 DIAGNOSIS — R5381 Other malaise: Secondary | ICD-10-CM | POA: Insufficient documentation

## 2013-05-21 NOTE — Assessment & Plan Note (Signed)
Etiology unclear, Exam otherwise benign, to check labs as documented, follow with expectant management  

## 2013-05-21 NOTE — Assessment & Plan Note (Signed)
Unclear etiology, for sports med referral

## 2013-05-24 ENCOUNTER — Encounter: Payer: Self-pay | Admitting: Family Medicine

## 2013-05-24 ENCOUNTER — Ambulatory Visit (INDEPENDENT_AMBULATORY_CARE_PROVIDER_SITE_OTHER): Payer: Medicare HMO | Admitting: Family Medicine

## 2013-05-24 ENCOUNTER — Other Ambulatory Visit (INDEPENDENT_AMBULATORY_CARE_PROVIDER_SITE_OTHER): Payer: Medicare HMO

## 2013-05-24 ENCOUNTER — Encounter: Payer: Self-pay | Admitting: Internal Medicine

## 2013-05-24 VITALS — BP 104/70 | HR 64 | Wt 119.0 lb

## 2013-05-24 DIAGNOSIS — M357 Hypermobility syndrome: Secondary | ICD-10-CM | POA: Insufficient documentation

## 2013-05-24 DIAGNOSIS — S76301A Unspecified injury of muscle, fascia and tendon of the posterior muscle group at thigh level, right thigh, initial encounter: Secondary | ICD-10-CM

## 2013-05-24 DIAGNOSIS — S76309A Unspecified injury of muscle, fascia and tendon of the posterior muscle group at thigh level, unspecified thigh, initial encounter: Secondary | ICD-10-CM | POA: Insufficient documentation

## 2013-05-24 DIAGNOSIS — M25552 Pain in left hip: Secondary | ICD-10-CM

## 2013-05-24 DIAGNOSIS — G5700 Lesion of sciatic nerve, unspecified lower limb: Secondary | ICD-10-CM

## 2013-05-24 DIAGNOSIS — G5701 Lesion of sciatic nerve, right lower limb: Secondary | ICD-10-CM | POA: Insufficient documentation

## 2013-05-24 DIAGNOSIS — M25559 Pain in unspecified hip: Secondary | ICD-10-CM

## 2013-05-24 DIAGNOSIS — M7071 Other bursitis of hip, right hip: Secondary | ICD-10-CM | POA: Insufficient documentation

## 2013-05-24 DIAGNOSIS — S79919A Unspecified injury of unspecified hip, initial encounter: Secondary | ICD-10-CM

## 2013-05-24 DIAGNOSIS — R5381 Other malaise: Secondary | ICD-10-CM

## 2013-05-24 DIAGNOSIS — Q796 Ehlers-Danlos syndrome, unspecified: Secondary | ICD-10-CM

## 2013-05-24 DIAGNOSIS — M76899 Other specified enthesopathies of unspecified lower limb, excluding foot: Secondary | ICD-10-CM

## 2013-05-24 LAB — BASIC METABOLIC PANEL
BUN: 13 mg/dL (ref 6–23)
CO2: 29 mEq/L (ref 19–32)
Chloride: 101 mEq/L (ref 96–112)
Glucose, Bld: 92 mg/dL (ref 70–99)
Potassium: 3.9 mEq/L (ref 3.5–5.1)

## 2013-05-24 LAB — CBC WITH DIFFERENTIAL/PLATELET
Basophils Relative: 0.6 % (ref 0.0–3.0)
Eosinophils Relative: 2 % (ref 0.0–5.0)
Hemoglobin: 13.9 g/dL (ref 12.0–15.0)
MCV: 94.9 fl (ref 78.0–100.0)
Monocytes Absolute: 0.5 10*3/uL (ref 0.1–1.0)
Neutrophils Relative %: 46.6 % (ref 43.0–77.0)
Platelets: 249 10*3/uL (ref 150.0–400.0)
WBC: 6.2 10*3/uL (ref 4.5–10.5)

## 2013-05-24 LAB — TSH: TSH: 1.49 u[IU]/mL (ref 0.35–5.50)

## 2013-05-24 LAB — HEPATIC FUNCTION PANEL
ALT: 21 U/L (ref 0–35)
AST: 26 U/L (ref 0–37)
Albumin: 4 g/dL (ref 3.5–5.2)
Total Bilirubin: 0.6 mg/dL (ref 0.3–1.2)
Total Protein: 6.9 g/dL (ref 6.0–8.3)

## 2013-05-24 MED ORDER — DICLOFENAC-MISOPROSTOL 50-0.2 MG PO TBEC: 1.0000 | DELAYED_RELEASE_TABLET | Freq: Two times a day (BID) | ORAL | Status: DC

## 2013-05-24 NOTE — Progress Notes (Signed)
I'm seeing this patient by the request  of:  Dr. Jonny Ruiz  CC: Right hip pain  HPI: Patient is a very pleasant 47 year old female coming in with right hip pain. Patient states that this has been an insidious onset with a persistent constant chronic pain. This pain has stopped her from running over the course last month and does not seem to be improving. Patient was running approximately 30 miles per week prior to this injury. Patient denies any radiation, any numbness or any weakness in the leg. Patient states that the pain is mostly located in the right buttocks area. Patient states that this does not seem to radiate. Patient states that the pain is getting worse even with walking or sitting. Patient does remember actually one instance when she was running and fell on that side feeling that there was any shearing motion in this area. Patient denies any swelling, denies any radiation down the leg and denies true weakness. Patient though is concerned because this is the long as she is ever been without running in her life she says inputs that severity at approximately 6/10.  Past medical, surgical, family and social history reviewed. Medications reviewed all in the electronic medical record.   Review of Systems: No headache, visual changes, nausea, vomiting, diarrhea, constipation, dizziness, abdominal pain, skin rash, fevers, chills, night sweats, weight loss, swollen lymph nodes, body aches, joint swelling, muscle aches, chest pain, shortness of breath, mood changes.   Objective:    Blood pressure 104/70, pulse 64, weight 119 lb (53.978 kg), SpO2 97.00%.   General: No apparent distress alert and oriented x3 mood and affect normal, dressed appropriately.  HEENT: Pupils equal, extraocular movements intact Respiratory: Patient's speak in full sentences and does not appear short of breath Cardiovascular: No lower extremity edema, non tender, no erythema Skin: Warm dry intact with no signs of infection  or rash on extremities or on axial skeleton. Abdomen: Soft nontender Neuro: Cranial nerves II through XII are intact, neurovascularly intact in all extremities with 2+ DTRs and 2+ pulses. Lymph: No lymphadenopathy of posterior or anterior cervical chain or axillae bilaterally.  Gait normal with good balance and coordination.  MSK: Non tender with full range of motion and good stability and symmetric strength and tone of shoulders, elbows, wrist, , knee and ankles bilaterally.  Back Exam:  Inspection: Unremarkable  Motion: Flexion 45 deg, Extension 45 deg, Side Bending to 45 deg bilaterally,  Rotation to 45 deg bilaterally  SLR laying: Negative  XSLR laying: Negative  Palpable tenderness: None. FABER: negative. Sensory change: Gross sensation intact to all lumbar and sacral dermatomes.  Reflexes: 2+ at both patellar tendons, 2+ at achilles tendons, Babinski's downgoing.  Hip: Right ROM IR: 35 Deg, ER: 45 Deg, Flexion: 120 Deg, Extension: 100 Deg, Abduction: 45 Deg, Adduction: 45 Deg Strength IR: 5/5, ER: 5/5, Flexion: 5/5, Extension: 5/5, Abduction: 5/5, Adduction: 5/5 Pelvic alignment unremarkable to inspection and palpation. Standing hip rotation and gait without trendelenburg sign / unsteadiness. Greater trochanter without tenderness to palpation. Patient does have tenderness over the pirformis as well as fascial tuberosity No pain with FABER or FADIR. No SI joint tenderness and normal minimal SI movement. Strength at foot  Plantar-flexion: 5/5 Dorsi-flexion: 5/5 Eversion: 5/5 Inversion: 5/5  Leg strength  Quad: 5/5 Hamstring: 5/5 Hip flexor: 5/5 Hip abductors: 5/5  Gait unremarkable. Patient when testing other joints do show that patient does have some mild hypermobility syndrome.  Musculoskeletal ultrasound was performed and interpreted by me today.  In the vicinity patient did have what appeared to be a ischial tuberosity bursitis that was occurring. 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 Patient was also found that she had a tear in the piriformis muscle itself with some good Doppler flow. This seems to be chronic in nature as well.  Impression and Recommendations:     This case required medical decision making of moderate complexity.

## 2013-05-24 NOTE — Assessment & Plan Note (Signed)
Some mild layering on ultrasound today that shows the patient does have some inflammation in the area. Home exercise program was given. Diclofenac twice daily for 10 days Icing protocol Nitroglycerin Discuss compression Will come back in 4 weeks' time. He is continuing to have trouble we will consider injection.

## 2013-05-24 NOTE — Assessment & Plan Note (Signed)
Piriformis Syndrome  Using an anatomical model, reviewed with the patient the structures involved and how they related to diagnosis. The patient indicated understanding.   The patient was given a handout from Dr. Rouzier's book "The Sports Medicine Patient Advisor" describing the anatomy and rehabilitation of the following condition: Piriformis Syndrome  Also given a handout with more extensive Piriformis stretching, hip flexor and abductor strengthening, ham stretching  Rec deep massage, explained self-massage with ball RTC in 3-4 weeks.  

## 2013-05-24 NOTE — Assessment & Plan Note (Signed)
Insertional chronic tendinopathy that likely is giving patient still continued pain. Nitroglycerin was discussed. Discussed stretching and patient is to be careful with her hypermobility syndrome. We'll continue to monitor closely. Followup in 3-4 weeks

## 2013-05-24 NOTE — Assessment & Plan Note (Signed)
Increase possiblity of chronic tendonopathy, will need more compression and muscle strengthening exercises.

## 2013-05-24 NOTE — Patient Instructions (Addendum)
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   Nitroglycerin Protocol   Apply 1/4 nitroglycerin patch to affected area daily.  Change position of patch within the affected area every 24 hours.  You may experience a headache during the first 1-2 weeks of using the patch, these should subside.  If you experience headaches after beginning nitroglycerin patch treatment, you may take your preferred over the counter pain reliever.  Another side effect of the nitroglycerin patch is skin irritation or rash related to patch adhesive.  Please notify our office if you develop more severe headaches or rash, and stop the patch.  Tendon healing with nitroglycerin patch may require 12 to 24 weeks depending on the extent of injury.  Men should not use if taking Viagra, Cialis, or Levitra.   Do not use if you have migraines or rosacea.   Diclofenac twice daily for the next 10 days been as needed Exercises daily [email or web address]- thigh sleeve size small.   Biking or swimming would be ideal, otherwise walking on soft surfaces.   Come back in 3-4 weeks

## 2013-06-02 ENCOUNTER — Ambulatory Visit: Payer: Medicare HMO | Admitting: Internal Medicine

## 2013-06-06 ENCOUNTER — Encounter: Payer: Self-pay | Admitting: Internal Medicine

## 2013-06-06 ENCOUNTER — Ambulatory Visit (INDEPENDENT_AMBULATORY_CARE_PROVIDER_SITE_OTHER): Payer: Medicare HMO | Admitting: Internal Medicine

## 2013-06-06 DIAGNOSIS — M25442 Effusion, left hand: Secondary | ICD-10-CM

## 2013-06-06 DIAGNOSIS — T6391XA Toxic effect of contact with unspecified venomous animal, accidental (unintentional), initial encounter: Secondary | ICD-10-CM

## 2013-06-06 DIAGNOSIS — M25449 Effusion, unspecified hand: Secondary | ICD-10-CM

## 2013-06-06 DIAGNOSIS — T63461A Toxic effect of venom of wasps, accidental (unintentional), initial encounter: Secondary | ICD-10-CM

## 2013-06-06 MED ORDER — METHYLPREDNISOLONE ACETATE 80 MG/ML IJ SUSP
80.0000 mg | Freq: Once | INTRAMUSCULAR | Status: AC
  Administered 2013-06-06: 80 mg via INTRAMUSCULAR

## 2013-06-06 MED ORDER — CEFTRIAXONE SODIUM 1 G IJ SOLR
1.0000 g | INTRAMUSCULAR | Status: DC
  Administered 2013-06-06: 1 g via INTRAMUSCULAR

## 2013-06-06 MED ORDER — PREDNISONE 10 MG PO TABS: ORAL_TABLET | ORAL | Status: DC

## 2013-06-06 NOTE — Progress Notes (Signed)
HPI: Pt presents today with complaints of left hand swelling from a bee sting one day prior. Pt stated she was walking in the park and got stung by a yellow jacket? Pt endorses itching, swelling, redness, and numbness and tingling to her hand. Pt denies shortness of breath, difficulty breathing, or any other swelling. Pt tried applying ice and OTC Benadryl with some relief.  No past medical history on file.  Current Outpatient Prescriptions  Medication Sig Dispense Refill  . Diclofenac-Misoprostol 50-0.2 MG TBEC Take 1 tablet by mouth 2 (two) times daily.  60 tablet  1  . Multiple Vitamin (MULTIVITAMIN) tablet Take 1 tablet by mouth daily.      . nitroGLYCERIN (NITRODUR - DOSED IN MG/24 HR) 0.2 mg/hr Apply 1/4 to affected area daily.  Change patch every 24 hours.  30 patch  1  . Norgestimate-Ethinyl Estradiol Triphasic (TRINESSA, 28,) 0.18/0.215/0.25 MG-35 MCG tablet Take 1 tablet by mouth daily.  3 Package  3   Current Facility-Administered Medications  Medication Dose Route Frequency Provider Last Rate Last Dose  . TDaP (BOOSTRIX) injection 0.5 mL  0.5 mL Intramuscular Once Corwin Levins, MD        Allergies  Allergen Reactions  . Bee Venom   . Penicillins      ROS:  Constitutional: Denies fever, malaise, fatigue, headache or abrupt weight changes. Marland Kitchen Respiratory: Denies difficulty breathing, shortness of breath, cough or sputum production.   Cardiovascular: Denies chest pain, chest tightness, palpitations or swelling in the hands or feet.  Skin:Endorses redness, swelling, and numbness/tingling to left hand.  Denies lesions or ulcercations.  Neurological: Denies dizziness, difficulty with memory, difficulty with speech or problems with balance and coordination.   No other specific complaints in a complete review of systems (except as listed in HPI above).  PE:  BP 118/82  Pulse 57  Temp(Src) 97.4 F (36.3 C) (Oral)  Ht 5\' 7"  (1.702 m)  Wt 120 lb 6 oz (54.602 kg)  BMI 18.85  kg/m2  SpO2 99% Wt Readings from Last 3 Encounters:  06/06/13 120 lb 6 oz (54.602 kg)  05/24/13 119 lb (53.978 kg)  05/20/13 121 lb (54.885 kg)    General: Appears their stated age, well developed, well nourished in NAD.  Neck: Normal range of motion. Neck supple, trachea midline. No massses, lumps or thyromegaly present.  Cardiovascular: Normal rate and rhythm. S1,S2 noted.  No murmur, rubs or gallops noted. No JVD or BLE edema. No carotid bruits noted. Pulmonary/Chest: Normal effort and positive vesicular breath sounds. No respiratory distress. No wheezes, rales or ronchi noted.  Skin: Left hand swollen with marked erythema to hand and upper left extremity. Decreased peripheral sensation noted, cool to touch with approx 4 second capillary refill.  Neurological: Alert and oriented. Cranial nerves II-XII intact. Coordination normal. +DTRs bilaterally. Psychiatric: Mood and affect normal. Behavior is normal. Judgment and thought content normal.      Assessment and Plan: Allergic Reaction secondary bee sting Depo 80mg  IM given Rocephin 1gram IM injection given Prednisone taper pack for 6 days Continue to use ice and OTC benadryl Follow up in 3-5 days if symptoms do not improve or worsen  Orbie Grupe S, Student-NP

## 2013-06-06 NOTE — Progress Notes (Signed)
Subjective:    Patient ID: Marie Avila, female    DOB: [birth year], 47 y.o.   MRN: [id number]  HPI  Pt presents to the clinic today with c/o left hand swelling, stiffness and redness following a bee sting yesterday. She does reports some itching of the affected hand and some tingling of the 3rd-5th fingers on that side. She has never had a reaction like this before. She did take Benadryl and put ice on it without any relief.  Review of Systems      History reviewed. No pertinent past medical history.  Current Outpatient Prescriptions  Medication Sig Dispense Refill  . Diclofenac-Misoprostol 50-0.2 MG TBEC Take 1 tablet by mouth 2 (two) times daily.  60 tablet  1  . Multiple Vitamin (MULTIVITAMIN) tablet Take 1 tablet by mouth daily.      . nitroGLYCERIN (NITRODUR - DOSED IN MG/24 HR) 0.2 mg/hr Apply 1/4 to affected area daily.  Change patch every 24 hours.  30 patch  1  . Norgestimate-Ethinyl Estradiol Triphasic (TRINESSA, 28,) 0.18/0.215/0.25 MG-35 MCG tablet Take 1 tablet by mouth daily.  3 Package  3   Current Facility-Administered Medications  Medication Dose Route Frequency Provider Last Rate Last Dose  . TDaP (BOOSTRIX) injection 0.5 mL  0.5 mL Intramuscular Once Corwin Levins, MD        Allergies  Allergen Reactions  . Bee Venom   . Penicillins     Family History  Problem Relation Age of Onset  . Hypertension Maternal Grandfather   . Heart disease Maternal Grandfather   . Sudden death Neg Hx   . Hyperlipidemia Neg Hx   . Heart attack Neg Hx   . Diabetes Neg Hx   . Cancer Father     leukemia  . Other Mother     Altzheimers  . Other Maternal Grandmother   . Stroke Maternal Grandmother   . Other Paternal Grandmother   . Other Paternal Grandfather     History   Social History  . Marital Status: Single    Spouse Name: N/A    Number of Children: N/A  . Years of Education: 16   Occupational History  . RN [employer]   Social History Main Topics  . Smoking  status: Never Smoker   . Smokeless tobacco: Never Used  . Alcohol Use: .5 - 1 oz/week    1-2 drink(s) per week     Comment: social  . Drug Use: No  . Sexual Activity: Yes    Partners: Male   Other Topics Concern  . Not on file   Social History Narrative  . No narrative on file     Constitutional: Denies fever, malaise, fatigue, headache or abrupt weight changes.   Skin: Denies rashes, lesions or ulcercations. Pt reports left hand swelling and redness.   No other specific complaints in a complete review of systems (except as listed in HPI above).   Objective:   Physical Exam   BP 118/82  Pulse 57  Temp(Src) 97.4 F (36.3 C) (Oral)  Ht 5\' 7"  (1.702 m)  Wt 120 lb 6 oz (54.602 kg)  BMI 18.85 kg/m2  SpO2 99% Wt Readings from Last 3 Encounters:  06/06/13 120 lb 6 oz (54.602 kg)  05/24/13 119 lb (53.978 kg)  05/20/13 121 lb (54.885 kg)    General: Appears her stated age, well developed, well nourished in NAD. Skin: Warm, dry and intact.  Redness streaking up the posterior forearm. 3+ swelling of the left  hand.  Cardiovascular: Normal rate and rhythm. S1,S2 noted.  No murmur, rubs or gallops noted. No JVD or BLE edema. No carotid bruits noted. Cap refill approx 4 secs on left 3rd-5th fingers. Pulmonary/Chest: Normal effort and positive vesicular breath sounds. No respiratory distress. No wheezes, rales or ronchi noted.   EKG:  BMET    Component Value Date/Time   NA 138 05/24/2013 1143   K 3.9 05/24/2013 1143   CL 101 05/24/2013 1143   CO2 29 05/24/2013 1143   GLUCOSE 92 05/24/2013 1143   BUN 13 05/24/2013 1143   CREATININE 1.0 05/24/2013 1143   CALCIUM 9.4 05/24/2013 1143    Lipid Panel     Component Value Date/Time   CHOL 204* 11/25/2011 1304   TRIG 54.0 11/25/2011 1304   HDL 126.00 11/25/2011 1304   CHOLHDL 2 11/25/2011 1304   VLDL 10.8 11/25/2011 1304    CBC    Component Value Date/Time   WBC 6.2 05/24/2013 1143   RBC 4.30 05/24/2013 1143   HGB 13.9  05/24/2013 1143   HCT 40.8 05/24/2013 1143   PLT 249.0 05/24/2013 1143   MCV 94.9 05/24/2013 1143   MCHC 34.1 05/24/2013 1143   RDW 13.4 05/24/2013 1143   LYMPHSABS 2.6 05/24/2013 1143   MONOABS 0.5 05/24/2013 1143   EOSABS 0.1 05/24/2013 1143   BASOSABS 0.0 05/24/2013 1143    Hgb A1C No results found for this basename: HGBA1C        Assessment & Plan:   Left hand swelling, redness and stiffness secondary to insect sting:  80 mg Depo IM 1 gm Rocephin for streaking- if you notice more streaking in the next 24 hours, call back and we will call you in some keflex eRx for pred taper for swelling Can continue benadryl at night  RTC by Thursday/Friday if no improvement- monitor for s/s of worsening perfusion in left 3rd-5th fingers. If worse, ER immediately.

## 2013-06-06 NOTE — Patient Instructions (Signed)
Bee, Wasp, or Hornet Sting °Your caregiver has diagnosed you as having an insect sting. An insect sting appears as a red lump in the skin that sometimes has a tiny hole in the center, or it may have a stinger in the center of the wound. The most common stings are from wasps, hornets and bees. °Individuals have different reactions to insect stings. °· A normal reaction may cause pain, swelling, and redness around the sting site. °· A localized allergic reaction may cause swelling and redness that extends beyond the sting site. °· A large local reaction may continue to develop over the next 12 to 36 hours. °· On occasion, the reactions can be severe (anaphylactic reaction). An anaphylactic reaction may cause wheezing; difficulty breathing; chest pain; fainting; raised, itchy, red patches on the skin; a sick feeling to your stomach (nausea); vomiting; cramping; or diarrhea. If you have had an anaphylactic reaction to an insect sting in the past, you are more likely to have one again. °HOME CARE INSTRUCTIONS  °· With bee stings, a small sac of poison is left in the wound. Brushing across this with something such as a credit card, or anything similar, will help remove this and decrease the amount of the reaction. This same procedure will not help a wasp sting as they do not leave behind a stinger and poison sac. °· Apply a cold compress for 10 to 20 minutes every hour for 1 to 2 days, depending on severity, to reduce swelling and itching. °· To lessen pain, a paste made of water and baking soda may be rubbed on the bite or sting and left on for 5 minutes. °· To relieve itching and swelling, you may use take medication or apply medicated creams or lotions as directed. °· Only take over-the-counter or prescription medicines for pain, discomfort, or fever as directed by your caregiver. °· Wash the sting site daily with soap and water. Apply antibiotic ointment on the sting site as directed. °· If you suffered a severe  reaction: °· If you did not require hospitalization, an adult will need to stay with you for 24 hours in case the symptoms return. °· You may need to wear a medical bracelet or necklace stating the allergy. °· You and your family need to learn when and how to use an anaphylaxis kit or epinephrine injection. °· If you have had a severe reaction before, always carry your anaphylaxis kit with you. °SEEK MEDICAL CARE IF:  °· None of the above helps within 2 to 3 days. °· The area becomes red, warm, tender, and swollen beyond the area of the bite or sting. °· You have an oral temperature above 102° F (38.9° C). °SEEK IMMEDIATE MEDICAL CARE IF:  °You have symptoms of an allergic reaction which are: °· Wheezing. °· Difficulty breathing. °· Chest pain. °· Lightheadedness or fainting. °· Itchy, raised, red patches on the skin. °· Nausea, vomiting, cramping or diarrhea. °ANY OF THESE SYMPTOMS MAY REPRESENT A SERIOUS PROBLEM THAT IS AN EMERGENCY. Do not wait to see if the symptoms will go away. Get medical help right away. Call your local emergency services (911 in [place] DO NOT drive yourself to the hospital. °MAKE SURE YOU:  °· Understand these instructions. °· Will watch your condition. °· Will get help right away if you are not doing well or get worse. °Document Released: 07/28/2005 Document Revised: 10/20/2011 Document Reviewed: 01/12/2010 °ExitCare® Patient Information ©2014 [employer]. ° °

## 2013-06-21 ENCOUNTER — Ambulatory Visit (INDEPENDENT_AMBULATORY_CARE_PROVIDER_SITE_OTHER): Payer: Medicare HMO | Admitting: Family Medicine

## 2013-06-21 ENCOUNTER — Encounter: Payer: Self-pay | Admitting: Family Medicine

## 2013-06-21 VITALS — BP 98/64 | HR 66

## 2013-06-21 DIAGNOSIS — M7071 Other bursitis of hip, right hip: Secondary | ICD-10-CM

## 2013-06-21 DIAGNOSIS — G5701 Lesion of sciatic nerve, right lower limb: Secondary | ICD-10-CM

## 2013-06-21 DIAGNOSIS — Q796 Ehlers-Danlos syndrome, unspecified: Secondary | ICD-10-CM

## 2013-06-21 DIAGNOSIS — M357 Hypermobility syndrome: Secondary | ICD-10-CM

## 2013-06-21 DIAGNOSIS — M76899 Other specified enthesopathies of unspecified lower limb, excluding foot: Secondary | ICD-10-CM

## 2013-06-21 DIAGNOSIS — G5700 Lesion of sciatic nerve, unspecified lower limb: Secondary | ICD-10-CM

## 2013-06-21 NOTE — Assessment & Plan Note (Signed)
Seems to be healed at this time. Patient has good strength and is going to continue to do the exercises. Patient is unable to do the nitroglycerin patch. Anti-inflammatories as needed. Patient will followup as needed.

## 2013-06-21 NOTE — Progress Notes (Signed)
  I'm seeing this patient by the request  of:  Dr. Jonny Ruiz  CC: Right hip pain followup  HPI: Patient is a very pleasant 47 year old female coming in with right hip pain for followup. Patient was diagnosed previously with initial bursitis as well as what appeared former syndrome on the right side. 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 Patient was given anti-inflammatories, home exercise program and nitroglycerin. Patient states she is approximately 60% better. Patient states that only sitting seems to give her any discomfort with her regular activities now. Patient states she is starting the running progression and seems to be increasing in to 15 minutes daily without any significant pain. Patient states with running he can get up to 4/10, with normal activity 2/10. Patient denies any nighttime awakening, unable to do the nitroglycerin secondary to side effects, has stopped anti-inflammatories. Has continued doing exercises. No new symptoms.   Past medical, surgical, family and social history reviewed. Medications reviewed all in the electronic medical record.   Review of Systems: No headache, visual changes, nausea, vomiting, diarrhea, constipation, dizziness, abdominal pain, skin rash, fevers, chills, night sweats, weight loss, swollen lymph nodes, body aches, joint swelling, muscle aches, chest pain, shortness of breath, mood changes.   Objective:    Blood pressure 98/64, pulse 66, SpO2 97.00%.   General: No apparent distress alert and oriented x3 mood and affect normal, dressed appropriately.  HEENT: Pupils equal, extraocular movements intact Respiratory: Patient's speak in full sentences and does not appear short of breath Cardiovascular: No lower extremity edema, non tender, no erythema Skin: Warm dry intact with no signs of infection or rash on extremities or on axial skeleton. Abdomen: Soft nontender Neuro: Cranial nerves II through XII are intact, neurovascularly intact in  all extremities with 2+ DTRs and 2+ pulses. Lymph: No lymphadenopathy of posterior or anterior cervical chain or axillae bilaterally.  Gait normal with good balance and coordination.  MSK: Non tender with full range of motion and good stability and symmetric strength and tone of shoulders, elbows, wrist, , knee and ankles bilaterally.  Back Exam:  Inspection: Unremarkable  Motion: Flexion 45 deg, Extension 45 deg, Side Bending to 45 deg bilaterally,  Rotation to 45 deg bilaterally  SLR laying: Negative  XSLR laying: Negative  Palpable tenderness: None. FABER: negative. Sensory change: Gross sensation intact to all lumbar and sacral dermatomes.  Reflexes: 2+ at both patellar tendons, 2+ at achilles tendons, Babinski's downgoing.  Hip: Right ROM IR: 45 Deg, ER: 45 Deg, Flexion: 120 Deg, Extension: 100 Deg, Abduction: 45 Deg, Adduction: 45 Deg Strength IR: 5/5, ER: 5/5, Flexion: 5/5, Extension: 5/5, Abduction: 5/5, Adduction: 5/5 Pelvic alignment unremarkable to inspection and palpation. Standing hip rotation and gait without trendelenburg sign / unsteadiness. Greater trochanter without tenderness to palpation. Patient no tenderness over the piriformis muscle Patient is still tender though over the ischial tuberosity on the right side mostly on the medial aspect. contralateral side is unremarkable No pain with FABER or FADIR. No SI joint tenderness and normal minimal SI movement. Strength at foot  Plantar-flexion: 5/5 Dorsi-flexion: 5/5 Eversion: 5/5 Inversion: 5/5  Leg strength  Quad: 5/5 Hamstring: 5/5 Hip flexor: 5/5 Hip abductors: 5/5  Gait unremarkable. Patient when testing other joints do show that patient does have some mild hypermobility syndrome.  Impression and Recommendations:     This case required medical decision making of moderate complexity.

## 2013-06-21 NOTE — Patient Instructions (Signed)
It is great to see you Consider compression shorts with running and 30 minutes afterward Ice 20 minutes after running Look up Askling exercises.  Continue running progression.  Anti- inflammatories as needed Come in 3-4 weeks.

## 2013-06-21 NOTE — Assessment & Plan Note (Signed)
Continue with compression and we discussed compression short I could also help decrease her range of motion to avoid injury. Patient will continue with running progression. Follow up in 4 week

## 2013-06-21 NOTE — Assessment & Plan Note (Signed)
Discuss changing position at work that could be beneficial, patient was given new exercises including Askling protocol, the patient will followup in 4 weeks. At that time if she continues to have some discomfort we will ultrasound the area and potentially do a ultrasound guided injection.

## 2013-07-14 ENCOUNTER — Other Ambulatory Visit (INDEPENDENT_AMBULATORY_CARE_PROVIDER_SITE_OTHER): Payer: Medicare HMO

## 2013-07-14 ENCOUNTER — Ambulatory Visit (INDEPENDENT_AMBULATORY_CARE_PROVIDER_SITE_OTHER): Payer: Medicare HMO | Admitting: Family Medicine

## 2013-07-14 ENCOUNTER — Encounter: Payer: Self-pay | Admitting: Family Medicine

## 2013-07-14 VITALS — BP 106/68 | HR 68

## 2013-07-14 DIAGNOSIS — G5700 Lesion of sciatic nerve, unspecified lower limb: Secondary | ICD-10-CM

## 2013-07-14 DIAGNOSIS — M7071 Other bursitis of hip, right hip: Secondary | ICD-10-CM

## 2013-07-14 DIAGNOSIS — M76899 Other specified enthesopathies of unspecified lower limb, excluding foot: Secondary | ICD-10-CM

## 2013-07-14 DIAGNOSIS — G5701 Lesion of sciatic nerve, right lower limb: Secondary | ICD-10-CM

## 2013-07-14 NOTE — Assessment & Plan Note (Signed)
Calcific today, given injection that I think will benefit.  Rest 48 hours Return to play slowly RTC in 3 weeks.  See patient instructions.

## 2013-07-14 NOTE — Patient Instructions (Signed)
Great to se eyou No exercises next 2 days then mild starting weekend then go for it on Monday but 50% weight for 1 week increase by 15% weekly Come back in 3 weeks if not perfect.

## 2013-07-14 NOTE — Progress Notes (Signed)
Pre-visit discussion using our clinic review tool. No additional management support is needed unless otherwise documented below in the visit note.  

## 2013-07-14 NOTE — Progress Notes (Signed)
CC: Right hip pain followup  HPI: Patient is here for followup of right buttock pain. Patient states that she has not made any significant strides since last visit. Patient may be another 20% better. Patient states that she can do usually all activities but she is scared to run more than 3 miles because she starts having some mild discomfort. Patient still has the most discomfort when she is sitting. This seems to be mostly right around the right buttocks. Patient has been diagnosed with potential bursitis, as well as piriformis syndrome. Patient denies any new symptoms such as radiation down her legs or any numbness. Patient states that he can tolerate it does look to be pain-free if possible.   Past medical, surgical, family and social history reviewed. Medications reviewed all in the electronic medical record.   Review of Systems: No headache, visual changes, nausea, vomiting, diarrhea, constipation, dizziness, abdominal pain, skin rash, fevers, chills, night sweats, weight loss, swollen lymph nodes, body aches, joint swelling, muscle aches, chest pain, shortness of breath, mood changes.   Objective:    Blood pressure 106/68, pulse 68, SpO2 96.00%.   General: No apparent distress alert and oriented x3 mood and affect normal, dressed appropriately.  HEENT: Pupils equal, extraocular movements intact Respiratory: Patient's speak in full sentences and does not appear short of breath Cardiovascular: No lower extremity edema, non tender, no erythema Skin: Warm dry intact with no signs of infection or rash on extremities or on axial skeleton. Abdomen: Soft nontender Neuro: Cranial nerves II through XII are intact, neurovascularly intact in all extremities with 2+ DTRs and 2+ pulses. Lymph: No lymphadenopathy of posterior or anterior cervical chain or axillae bilaterally.  Gait normal with good balance and coordination.  MSK: Non tender with full range of motion and good stability and symmetric  strength and tone of shoulders, elbows, wrist, , knee and ankles bilaterally.  Back Exam:  Inspection: Unremarkable  Motion: Flexion 45 deg, Extension 45 deg, Side Bending to 45 deg bilaterally,  Rotation to 45 deg bilaterally  SLR laying: Negative  XSLR laying: Negative  Palpable tenderness: None. FABER: negative. Sensory change: Gross sensation intact to all lumbar and sacral dermatomes.  Reflexes: 2+ at both patellar tendons, 2+ at achilles tendons, Babinski's downgoing.  Hip: Right ROM IR: 45 Deg, ER: 45 Deg, Flexion: 120 Deg, Extension: 100 Deg, Abduction: 45 Deg, Adduction: 45 Deg Strength IR: 5/5, ER: 5/5, Flexion: 5/5, Extension: 5/5, Abduction: 5/5, Adduction: 5/5 Pelvic alignment unremarkable to inspection and palpation. Standing hip rotation and gait without trendelenburg sign / unsteadiness. Greater trochanter without tenderness to palpation. Patient no tenderness over the piriformis muscle Patient is still tender though over the ischial tuberosity on the right side mostly on the medial aspect. contralateral side is unremarkable No pain with FABER or FADIR. No SI joint tenderness and normal minimal SI movement. Strength at foot  Plantar-flexion: 5/5 Dorsi-flexion: 5/5 Eversion: 5/5 Inversion: 5/5  Leg strength  Quad: 5/5 Hamstring: 5/5 Hip flexor: 5/5 Hip abductors: 5/5  Gait unremarkable. Patient when testing other joints do show that patient does have some mild hypermobility syndrome.  Limited muscular skeletal ultrasound was performed and interpreted by Antoine Primas, M Limited ultrasound the patient's ischial area shows the patient does have a calcific initial bursa over the right ischium. This measures approximately 2 cm in diameter. No true muscle tear appreciated in the hamstring. Patient was given an injection of 4 cc of 0.5% Marcaine and 1 cc of Kenalog 40 mg/dL under ultrasound  guidance into the calcific bursa. Patient tolerated the procedure well with no blood  loss. Patient did have complete resolution of pain immediately.  Impression and Recommendations:     This case required medical decision making of moderate complexity.

## 2013-11-02 ENCOUNTER — Other Ambulatory Visit: Payer: Self-pay | Admitting: Nurse Practitioner

## 2013-11-02 NOTE — Telephone Encounter (Signed)
Return call from patient.  She states she has 2 weeks left.  Advised I would refill through appt.

## 2013-11-02 NOTE — Telephone Encounter (Signed)
eScribe request from [facility] for refill on TRI-PREVIFEM Last filled - 12/07/12, #3 X 3 Last AEX - 12/07/12 Next AEX - 12/13/13  Message left for patient to return call.  ? Is she out of meds, or does she have enough to last until AEX?

## 2013-11-28 ENCOUNTER — Telehealth: Payer: Self-pay | Admitting: Nurse Practitioner

## 2013-11-28 MED ORDER — NORGESTIM-ETH ESTRAD TRIPHASIC 0.18/0.215/0.25 MG-35 MCG PO TABS: ORAL_TABLET | ORAL | Status: DC

## 2013-11-28 NOTE — Telephone Encounter (Signed)
Patient needs a refill of her bc said she will run out a few days before her appt [facility]

## 2013-11-28 NOTE — Telephone Encounter (Signed)
rx sent of triprevifem #28 with 0 refills.pt notified

## 2013-11-28 NOTE — Telephone Encounter (Signed)
aex was 12-07-12 & upcoming aex is 12-13-13

## 2013-12-13 ENCOUNTER — Ambulatory Visit (INDEPENDENT_AMBULATORY_CARE_PROVIDER_SITE_OTHER): Payer: Managed Care, Other (non HMO) | Admitting: Nurse Practitioner

## 2013-12-13 ENCOUNTER — Encounter: Payer: Self-pay | Admitting: Nurse Practitioner

## 2013-12-13 VITALS — BP 106/70 | HR 56 | Ht 67.25 in | Wt 119.0 lb

## 2013-12-13 DIAGNOSIS — Z01419 Encounter for gynecological examination (general) (routine) without abnormal findings: Secondary | ICD-10-CM

## 2013-12-13 DIAGNOSIS — Z0000 Encounter for general adult medical examination without abnormal findings: Secondary | ICD-10-CM

## 2013-12-13 MED ORDER — NORGESTIM-ETH ESTRAD TRIPHASIC 0.18/0.215/0.25 MG-35 MCG PO TABS: ORAL_TABLET | ORAL | Status: DC

## 2013-12-13 NOTE — Patient Instructions (Signed)

## 2013-12-13 NOTE — Progress Notes (Signed)
Patient ID: Marie Avila, female   DOB: 17-Aug-1965, 48 y.o.   MRN: [id number] 48 y.o. G0P0 Single Caucasian Fe here for annual exam.  Menses now at 4 days.  No recent late cycle BTB.  Some increase in PMS over the past several years.  This may related to caring for elderly mother with Alzheimer's. New partner since October.   Patient's last menstrual period was 12/05/2013.          Sexually active: yes  The current method of family planning is OCP (estrogen/progesterone).    Exercising: yes  Home exercise routine includes Running 3-5 miles 4 times per week, and weights.. Smoker:  no  Health Maintenance: Pap:10/16/11 WNL, negative HR HPV MMG: None TDaP:  11/25/11 Labs:  PCP in EPIC   reports that she has never smoked. She has never used smokeless tobacco. She reports that she drinks about .5 - 1 ounces of alcohol per week. She reports that she does not use illicit drugs.  History reviewed. No pertinent past medical history.  Past Surgical History  Procedure Laterality Date  . Wisdom tooth extraction  age 67    Current Outpatient Prescriptions  Medication Sig Dispense Refill  . Multiple Vitamin (MULTIVITAMIN) tablet Take 1 tablet by mouth daily.      . Norgestimate-Ethinyl Estradiol Triphasic (TRI-PREVIFEM) 0.18/0.215/0.25 MG-35 MCG tablet TAKE 1 TABLET BY MOUTH EVERY DAY  3 Package  3   No current facility-administered medications for this visit.    Family History  Problem Relation Age of Onset  . Hypertension Maternal Grandfather   . Heart disease Maternal Grandfather   . Sudden death Neg Hx   . Hyperlipidemia Neg Hx   . Heart attack Neg Hx   . Diabetes Neg Hx   . Cancer Father     leukemia  . Other Mother     Altzheimers  . Other Maternal Grandmother   . Stroke Maternal Grandmother   . Other Paternal Grandmother   . Other Paternal Grandfather     ROS:  Pertinent items are noted in HPI.  Otherwise, a comprehensive ROS was negative.  Exam:   BP 106/70  Pulse 56  Ht  5' 7.25" (1.708 m)  Wt 119 lb (53.978 kg)  BMI 18.50 kg/m2  LMP 12/05/2013 Height: 5' 7.25" (170.8 cm)  Ht Readings from Last 3 Encounters:  12/13/13 5' 7.25" (1.708 m)  06/06/13 5\' 7"  (1.702 m)  05/20/13 5\' 7"  (1.702 m)    General appearance: alert, cooperative and appears stated age Head: Normocephalic, without obvious abnormality, atraumatic Neck: no adenopathy, supple, symmetrical, trachea midline and thyroid normal to inspection and palpation Lungs: clear to auscultation bilaterally Breasts: normal appearance, no masses or tenderness Heart: regular rate and rhythm Abdomen: soft, non-tender; no masses,  no organomegaly Extremities: extremities normal, atraumatic, no cyanosis or edema Skin: Skin color, texture, turgor normal. No rashes or lesions Lymph nodes: Cervical, supraclavicular, and axillary nodes normal. No abnormal inguinal nodes palpated Neurologic: Grossly normal   Pelvic: External genitalia:  no lesions              Urethra:  normal appearing urethra with no masses, tenderness or lesions              Bartholin's and Skene's: normal                 Vagina: normal appearing vagina with normal color and discharge, no lesions              Cervix:  anteverted              Pap taken: yes Bimanual Exam:  Uterus:  normal size, contour, position, consistency, mobility, non-tender              Adnexa: no mass, fullness, tenderness               Rectovaginal: Confirms               Anus:  normal sphincter tone, no lesions  A:  Well Woman with normal exam  Contraception  Family stressors  P:   Reviewed health and wellness pertinent to exam  Pap smear taken today  Mammogram is due and she will schedule  Counseled on breast self exam, mammography screening, use and side effects of OCP's, adequate intake of calcium and vitamin D, diet and exercise return annually or prn  An After Visit Summary was printed and given to the patient.

## 2013-12-16 LAB — IPS PAP TEST WITH HPV

## 2013-12-18 NOTE — Progress Notes (Signed)
Encounter reviewed by Dr. Takari Duncombe Silva.  

## 2014-11-27 ENCOUNTER — Other Ambulatory Visit: Payer: Self-pay | Admitting: Nurse Practitioner

## 2014-11-27 NOTE — Telephone Encounter (Signed)
Medication refill request: Tri-Previfem  Last AEX:  12/13/13 PG Next AEX: 12/19/14 PG Last MMG (if hormonal medication request): none Refill authorized: 12/13/13 #3packs/3R. Today #1pack/0R?

## 2014-12-18 ENCOUNTER — Telehealth: Payer: Self-pay | Admitting: Nurse Practitioner

## 2014-12-18 NOTE — Telephone Encounter (Signed)
Provider cx. Left voicemail for pt to cb and rs. Recall entered.  °

## 2014-12-19 ENCOUNTER — Ambulatory Visit: Payer: Managed Care, Other (non HMO) | Admitting: Nurse Practitioner

## 2014-12-26 ENCOUNTER — Other Ambulatory Visit: Payer: Self-pay | Admitting: Nurse Practitioner

## 2014-12-26 NOTE — Telephone Encounter (Signed)
Medication refill request: Tri Previfem  Last AEX:  12/13/13 PG Next AEX: 01/15/15 Dr Quincy Simmonds Last MMG (if hormonal medication request): None Refill authorized: 11/27/14 #1pack w/0R. Today please advise

## 2015-01-15 ENCOUNTER — Encounter: Payer: Self-pay | Admitting: Obstetrics and Gynecology

## 2015-01-15 ENCOUNTER — Ambulatory Visit (INDEPENDENT_AMBULATORY_CARE_PROVIDER_SITE_OTHER): Payer: Managed Care, Other (non HMO) | Admitting: Obstetrics and Gynecology

## 2015-01-15 VITALS — BP 110/68 | HR 74 | Ht 68.0 in | Wt 122.6 lb

## 2015-01-15 DIAGNOSIS — R5383 Other fatigue: Secondary | ICD-10-CM

## 2015-01-15 DIAGNOSIS — Z01419 Encounter for gynecological examination (general) (routine) without abnormal findings: Secondary | ICD-10-CM

## 2015-01-15 DIAGNOSIS — Z0000 Encounter for general adult medical examination without abnormal findings: Secondary | ICD-10-CM

## 2015-01-15 LAB — POCT URINALYSIS DIPSTICK
LEUKOCYTES UA: NEGATIVE
UROBILINOGEN UA: NEGATIVE
pH, UA: 5

## 2015-01-15 MED ORDER — NORGESTIM-ETH ESTRAD TRIPHASIC 0.18/0.215/0.25 MG-35 MCG PO TABS: 1.0000 | ORAL_TABLET | Freq: Every day | ORAL | Status: DC

## 2015-01-15 NOTE — Patient Instructions (Signed)

## 2015-01-15 NOTE — Progress Notes (Signed)
49 y.o. G0P0 Single Caucasian female here for annual exam.   Wants refill on OCPs. Light cycles.   Some fatigue.  Mother had iron deficiency anemia.   Patient wants labs except cholesterol panel.  Declines mammogram.  Would not do mammogram until age 6.  Does self breast exams.  No family history of breast cancer.   Works in utilization review.   Likes to move often. Feels restless if she lives in a community for too long.  PCP:   Cecilio Asper, MD  Patient's last menstrual period was 01/08/2015.          Sexually active: Yes.    The current method of family planning is OCP (estrogen/progesterone).    Exercising: Yes.    run  3x/wk Smoker:  no  Health Maintenance: Pap:  12/13/13 wnl neg hr hpv History of abnormal Pap:  no MMG:  None.   Colonoscopy:  n/a BMD:   N/a  Result  none TDaP:  11/25/2011 Screening Labs: yes  Hb today: 13.2 , Urine today: negative   reports that she has never smoked. She has never used smokeless tobacco. She reports that she drinks about 1.2 oz of alcohol per week. She reports that she does not use illicit drugs.  No past medical history on file.  Past Surgical History  Procedure Laterality Date  . Wisdom tooth extraction  age 53    Current Outpatient Prescriptions  Medication Sig Dispense Refill  . Multiple Vitamin (MULTIVITAMIN) tablet Take 1 tablet by mouth daily.    . TRI-PREVIFEM 0.18/0.215/0.25 MG-35 MCG tablet TAKE 1 TABLET BY MOUTH EVERY DAY 28 tablet 0   No current facility-administered medications for this visit.    Family History  Problem Relation Age of Onset  . Hypertension Maternal Grandfather   . Heart disease Maternal Grandfather   . Sudden death Neg Hx   . Hyperlipidemia Neg Hx   . Heart attack Neg Hx   . Diabetes Neg Hx   . Cancer Father     leukemia  . Other Mother     Altzheimers  . Other Maternal Grandmother   . Stroke Maternal Grandmother   . Other Paternal Grandmother   . Other Paternal Grandfather      ROS:  Pertinent items are noted in HPI.  Otherwise, a comprehensive ROS was negative.  Exam:   BP 110/68 mmHg  Pulse 74  Ht 5\' 8"  (1.727 m)  Wt 122 lb 9.6 oz (55.611 kg)  BMI 18.65 kg/m2  LMP 01/08/2015    General appearance: alert, cooperative and appears stated age Head: Normocephalic, without obvious abnormality, atraumatic Neck: no adenopathy, supple, symmetrical, trachea midline and thyroid normal to inspection and palpation Lungs: clear to auscultation bilaterally Breasts: normal appearance, no masses or tenderness, Inspection negative, No nipple retraction or dimpling, No nipple discharge or bleeding, No axillary or supraclavicular adenopathy Heart: regular rate and rhythm Abdomen: soft, non-tender; bowel sounds normal; no masses,  no organomegaly Extremities: extremities normal, atraumatic, no cyanosis or edema Skin: Skin color, texture, turgor normal. No rashes or lesions Lymph nodes: Cervical, supraclavicular, and axillary nodes normal. No abnormal inguinal nodes palpated Neurologic: Grossly normal  Pelvic: External genitalia:  no lesions              Urethra:  normal appearing urethra with no masses, tenderness or lesions              Bartholins and Skenes: normal  Vagina: normal appearing vagina with normal color and discharge, no lesions              Cervix: no lesions              Pap taken: No. Bimanual Exam:  Uterus:  normal size, contour, position, consistency, mobility, non-tender              Adnexa: normal adnexa and no mass, fullness, tenderness              Rectovaginal: Yes.  .  Confirms.              Anus:  normal sphincter tone, no lesions  Chaperone was present for exam.  Assessment:   Well woman visit with normal exam. Fatigue.  Plan: Yearly mammogram recommended after age 50 until consensus guidelines are available. She understands that she is taking a hormonal medication, OCPs, that can stimulate a progesterone or estrogen  receptor positive breast cancer were she to have one.  She wishes to continue OCPs and declines mammogram. Recommended self breast exam.  Pap and HR HPV as above. Discussed Calcium, Vitamin D, regular exercise program including cardiovascular and weight bearing exercise. Labs performed.  Yes.  .   See orders. Refills given on medications.  Yes.  .  See orders.  Refill OCPs for one year.  Follow up annually and prn.     After visit summary provided.

## 2015-01-16 LAB — CBC
HEMATOCRIT: 40.5 % (ref 36.0–46.0)
Hemoglobin: 13.4 g/dL (ref 12.0–15.0)
MCH: 31.2 pg (ref 26.0–34.0)
MCHC: 33.1 g/dL (ref 30.0–36.0)
MCV: 94.4 fL (ref 78.0–100.0)
MPV: 10.2 fL (ref 8.6–12.4)
PLATELETS: 242 10*3/uL (ref 150–400)
RBC: 4.29 MIL/uL (ref 3.87–5.11)
RDW: 13.1 % (ref 11.5–15.5)
WBC: 5.3 10*3/uL (ref 4.0–10.5)

## 2015-01-16 LAB — COMPREHENSIVE METABOLIC PANEL
ALT: 20 U/L (ref 0–35)
AST: 23 U/L (ref 0–37)
Albumin: 3.9 g/dL (ref 3.5–5.2)
Alkaline Phosphatase: 35 U/L — ABNORMAL LOW (ref 39–117)
BUN: 16 mg/dL (ref 6–23)
CALCIUM: 9.3 mg/dL (ref 8.4–10.5)
CHLORIDE: 102 meq/L (ref 96–112)
CO2: 27 meq/L (ref 19–32)
Creat: 1.14 mg/dL — ABNORMAL HIGH (ref 0.50–1.10)
Glucose, Bld: 100 mg/dL — ABNORMAL HIGH (ref 70–99)
Potassium: 4.3 mEq/L (ref 3.5–5.3)
SODIUM: 138 meq/L (ref 135–145)
Total Bilirubin: 0.5 mg/dL (ref 0.2–1.2)
Total Protein: 6.9 g/dL (ref 6.0–8.3)

## 2015-01-16 LAB — HEMOGLOBIN, FINGERSTICK: HEMOGLOBIN, FINGERSTICK: 13.2 g/dL (ref 12.0–16.0)

## 2015-01-16 LAB — FERRITIN: Ferritin: 13 ng/mL (ref 10–291)

## 2015-01-16 LAB — TSH: TSH: 2.286 u[IU]/mL (ref 0.350–4.500)

## 2015-01-17 ENCOUNTER — Other Ambulatory Visit: Payer: Self-pay | Admitting: Obstetrics and Gynecology

## 2015-01-17 DIAGNOSIS — R7989 Other specified abnormal findings of blood chemistry: Secondary | ICD-10-CM

## 2015-01-17 LAB — VITAMIN D 25 HYDROXY (VIT D DEFICIENCY, FRACTURES): VIT D 25 HYDROXY: 75 ng/mL (ref 30–100)

## 2015-01-18 ENCOUNTER — Telehealth: Payer: Self-pay

## 2015-01-18 NOTE — Telephone Encounter (Signed)
Spoke with patient. Advised of results as seen below. Patient is agreeable. Lab appointment scheduled for 7/12 at 8:40am. Patient is agreeable to date and time.  Routing to provider for final review. Patient agreeable to disposition. Will close encounter.

## 2015-01-18 NOTE — Telephone Encounter (Signed)
-----  Message from Nunzio Cobbs, MD sent at 01/17/2015  8:42 PM EDT ----- Please inform patient of slightly elevated creatinine.  I would like to retest this in one month.  Please make a lab appointment. I will place a future order.  All other lab testing unremarkable.  Glucose at the cut off of 100, but I do not recall if this was fasting or not.  Alk phos was a little low, but this is not a problem.  It is an issue if it is elevated.  All other testing unremarkable.  Cc- Marisa Sprinkles

## 2015-01-23 ENCOUNTER — Other Ambulatory Visit: Payer: Self-pay | Admitting: Nurse Practitioner

## 2015-02-20 ENCOUNTER — Other Ambulatory Visit (INDEPENDENT_AMBULATORY_CARE_PROVIDER_SITE_OTHER): Payer: Managed Care, Other (non HMO)

## 2015-02-20 DIAGNOSIS — R748 Abnormal levels of other serum enzymes: Secondary | ICD-10-CM

## 2015-02-20 DIAGNOSIS — R7989 Other specified abnormal findings of blood chemistry: Secondary | ICD-10-CM

## 2015-02-20 LAB — BASIC METABOLIC PANEL
BUN: 14 mg/dL (ref 6–23)
CALCIUM: 8.8 mg/dL (ref 8.4–10.5)
CHLORIDE: 102 meq/L (ref 96–112)
CO2: 26 mEq/L (ref 19–32)
CREATININE: 1 mg/dL (ref 0.50–1.10)
Glucose, Bld: 89 mg/dL (ref 70–99)
Potassium: 4.7 mEq/L (ref 3.5–5.3)
Sodium: 138 mEq/L (ref 135–145)

## 2015-03-29 ENCOUNTER — Ambulatory Visit: Payer: Medicare HMO | Admitting: Sports Medicine

## 2015-12-25 ENCOUNTER — Other Ambulatory Visit: Payer: Self-pay | Admitting: Obstetrics and Gynecology

## 2015-12-25 NOTE — Telephone Encounter (Signed)
Medication refill request: Tri-Previfem  Last AEX: 01/15/15 with BS  Next AEX: 02/14/16 with BS  Last MMG (if hormonal medication request): n/a Refill authorized: #3 packs

## 2016-02-14 ENCOUNTER — Ambulatory Visit (INDEPENDENT_AMBULATORY_CARE_PROVIDER_SITE_OTHER): Payer: Managed Care, Other (non HMO) | Admitting: Obstetrics and Gynecology

## 2016-02-14 ENCOUNTER — Encounter: Payer: Self-pay | Admitting: Obstetrics and Gynecology

## 2016-02-14 VITALS — BP 110/66 | HR 70 | Resp 16 | Ht 67.5 in | Wt 127.4 lb

## 2016-02-14 DIAGNOSIS — Z113 Encounter for screening for infections with a predominantly sexual mode of transmission: Secondary | ICD-10-CM | POA: Diagnosis not present

## 2016-02-14 DIAGNOSIS — Z0000 Encounter for general adult medical examination without abnormal findings: Secondary | ICD-10-CM | POA: Diagnosis not present

## 2016-02-14 DIAGNOSIS — Z01419 Encounter for gynecological examination (general) (routine) without abnormal findings: Secondary | ICD-10-CM

## 2016-02-14 DIAGNOSIS — R7989 Other specified abnormal findings of blood chemistry: Secondary | ICD-10-CM | POA: Insufficient documentation

## 2016-02-14 LAB — POCT URINALYSIS DIPSTICK
Bilirubin, UA: NEGATIVE
Blood, UA: NEGATIVE
Glucose, UA: NEGATIVE
Ketones, UA: NEGATIVE
LEUKOCYTES UA: NEGATIVE
Nitrite, UA: NEGATIVE
PH UA: 5
PROTEIN UA: NEGATIVE
UROBILINOGEN UA: NEGATIVE

## 2016-02-14 MED ORDER — NORGESTIM-ETH ESTRAD TRIPHASIC 0.18/0.215/0.25 MG-35 MCG PO TABS: 1.0000 | ORAL_TABLET | Freq: Every day | ORAL | Status: DC

## 2016-02-14 NOTE — Progress Notes (Signed)
Patient ID: Marie Avila, female   DOB: 10-Aug-1966, 50 y.o.   MRN: [id number] 50 y.o. G0P0 Single Caucasian female here for annual exam.    Working from home for one year.   Wants to continue with OCPs until age 11.  Mother had menopause in her early 54s.  New partner.  Used condoms.  PCP:   Cathlean Cower, MD  Patient's last menstrual period was 01/27/2016 (exact date).     Period Cycle (Days): 30 Period Duration (Days): 3 Period Pattern: Regular Menstrual Flow: Light Menstrual Control: Tampon Menstrual Control Change Freq (Hours): on heaviest day changes twice daily Dysmenorrhea: (!) Mild (cramps only on first day) Dysmenorrhea Symptoms: Cramping     Sexually active: No. female The current method of family planning is OCP (estrogen/progesterone)--Tri-Previfem    Exercising: Yes.    aerobics & weights. Smoker:  no  Health Maintenance: Pap:  12-13-13 Neg:Neg HR HPV History of abnormal Pap:  no MMG:  NEVER Colonoscopy:  n/a BMD:   n/a  Result  n/a TDaP:  11-25-11 Gardasil:   N/A Screening Labs:  Hb today --- Urine today: Neg   reports that she has never smoked. She has never used smokeless tobacco. She reports that she drinks about 1.2 oz of alcohol per week. She reports that she does not use illicit drugs.  History reviewed. No pertinent past medical history.  Past Surgical History  Procedure Laterality Date  . Wisdom tooth extraction  age 63    Current Outpatient Prescriptions  Medication Sig Dispense Refill  . docusate sodium (COLACE) 100 MG capsule Take 100 mg by mouth at bedtime.    . TRI-PREVIFEM 0.18/0.215/0.25 MG-35 MCG tablet TAKE 1 TABLET BY MOUTH DAILY. 84 tablet 0   No current facility-administered medications for this visit.    Family History  Problem Relation Age of Onset  . Hypertension Maternal Grandfather   . Heart disease Maternal Grandfather   . Sudden death Neg Hx   . Hyperlipidemia Neg Hx   . Heart attack Neg Hx   . Diabetes Neg Hx   . Cancer  Father     leukemia  . Other Mother     Altzheimers  . Other Maternal Grandmother   . Stroke Maternal Grandmother   . Other Paternal Grandmother   . Other Paternal Grandfather     ROS:  Pertinent items are noted in HPI.  Otherwise, a comprehensive ROS was negative.  Exam:   BP 110/66 mmHg  Pulse 70  Resp 16  Ht 5' 7.5" (1.715 m)  Wt 127 lb 6.4 oz (57.788 kg)  BMI 19.65 kg/m2  LMP 01/27/2016 (Exact Date)    General appearance: alert, cooperative and appears stated age Head: Normocephalic, without obvious abnormality, atraumatic Neck: no adenopathy, supple, symmetrical, trachea midline and thyroid normal to inspection and palpation Lungs: clear to auscultation bilaterally Breasts: normal appearance, no masses or tenderness, Inspection negative, No nipple retraction or dimpling, No nipple discharge or bleeding, No axillary or supraclavicular adenopathy Heart: regular rate and rhythm Abdomen: incisions:  No.    , soft, non-tender; no masses, no organomegaly Extremities: extremities normal, atraumatic, no cyanosis or edema Skin: Skin color, texture, turgor normal. No rashes or lesions Lymph nodes: Cervical, supraclavicular, and axillary nodes normal. No abnormal inguinal nodes palpated Neurologic: Grossly normal  Pelvic: External genitalia:  no lesions              Urethra:  normal appearing urethra with no masses, tenderness or lesions  Bartholins and Skenes: normal                 Vagina: normal appearing vagina with normal color and discharge, no lesions              Cervix: no lesions              Pap taken: No. Bimanual Exam:  Uterus:  normal size, contour, position, consistency, mobility, non-tender              Adnexa: normal adnexa and no mass, fullness, tenderness              Rectal exam: Yes.  .  Confirms.              Anus:  normal sphincter tone, no lesions  Chaperone was present for exam.  Assessment:   Well woman visit with normal exam. Desire for  STD testing.   Plan: Yearly mammogram recommended after age 19.  Patient agrees to mammogram.  [facility] Recommended self breast exam.  Pap and HR HPV as above. Discussed Calcium, Vitamin D, regular exercise program including cardiovascular and weight bearing exercise. Labs performed.  Yes.  .   See orders.  STD screening, CMP, lipid profile. Prescription medication(s) given.  Yes.  .  See orders.  Refill on OCPs for one year. Follow up annually and prn.     After visit summary provided.

## 2016-02-14 NOTE — Patient Instructions (Signed)
Health Maintenance, Female Adopting a healthy lifestyle and getting preventive care can go a long way to promote health and wellness. Talk with your health care provider about what schedule of regular examinations is right for you. This is a good chance for you to check in with your provider about disease prevention and staying healthy. In between checkups, there are plenty of things you can do on your own. Experts have done a lot of research about which lifestyle changes and preventive measures are most likely to keep you healthy. Ask your health care provider for more information. WEIGHT AND DIET  Eat a healthy diet  Be sure to include plenty of vegetables, fruits, low-fat dairy products, and lean protein.  Do not eat a lot of foods high in solid fats, added sugars, or salt.  Get regular exercise. This is one of the most important things you can do for your health.  Most adults should exercise for at least 150 minutes each week. The exercise should increase your heart rate and make you sweat (moderate-intensity exercise).  Most adults should also do strengthening exercises at least twice a week. This is in addition to the moderate-intensity exercise.  Maintain a healthy weight  Body mass index (BMI) is a measurement that can be used to identify possible weight problems. It estimates body fat based on height and weight. Your health care provider can help determine your BMI and help you achieve or maintain a healthy weight.  For females 20 years of age and older:   A BMI below 18.5 is considered underweight.  A BMI of 18.5 to 24.9 is normal.  A BMI of 25 to 29.9 is considered overweight.  A BMI of 30 and above is considered obese.  Watch levels of cholesterol and blood lipids  You should start having your blood tested for lipids and cholesterol at 49 years of age, then have this test every 5 years.  You may need to have your cholesterol levels checked more often if:  Your lipid  or cholesterol levels are high.  You are older than 50 years of age.  You are at high risk for heart disease.  CANCER SCREENING   Lung Cancer  Lung cancer screening is recommended for adults 55-80 years old who are at high risk for lung cancer because of a history of smoking.  A yearly low-dose CT scan of the lungs is recommended for people who:  Currently smoke.  Have quit within the past 15 years.  Have at least a 30-pack-year history of smoking. A pack year is smoking an average of one pack of cigarettes a day for 1 year.  Yearly screening should continue until it has been 15 years since you quit.  Yearly screening should stop if you develop a health problem that would prevent you from having lung cancer treatment.  Breast Cancer  Practice breast self-awareness. This means understanding how your breasts normally appear and feel.  It also means doing regular breast self-exams. Let your health care provider know about any changes, no matter how small.  If you are in your 20s or 30s, you should have a clinical breast exam (CBE) by a health care provider every 1-3 years as part of a regular health exam.  If you are 40 or older, have a CBE every year. Also consider having a breast X-ray (mammogram) every year.  If you have a family history of breast cancer, talk to your health care provider about genetic screening.  If you   are at high risk for breast cancer, talk to your health care provider about having an MRI and a mammogram every year.  Breast cancer gene (BRCA) assessment is recommended for women who have family members with BRCA-related cancers. BRCA-related cancers include:  Breast.  Ovarian.  Tubal.  Peritoneal cancers.  Results of the assessment will determine the need for genetic counseling and BRCA1 and BRCA2 testing. Cervical Cancer Your health care provider may recommend that you be screened regularly for cancer of the pelvic organs (ovaries, uterus, and  vagina). This screening involves a pelvic examination, including checking for microscopic changes to the surface of your cervix (Pap test). You may be encouraged to have this screening done every 3 years, beginning at age 21.  For women ages 30-65, health care providers may recommend pelvic exams and Pap testing every 3 years, or they may recommend the Pap and pelvic exam, combined with testing for human papilloma virus (HPV), every 5 years. Some types of HPV increase your risk of cervical cancer. Testing for HPV may also be done on women of any age with unclear Pap test results.  Other health care providers may not recommend any screening for nonpregnant women who are considered low risk for pelvic cancer and who do not have symptoms. Ask your health care provider if a screening pelvic exam is right for you.  If you have had past treatment for cervical cancer or a condition that could lead to cancer, you need Pap tests and screening for cancer for at least 20 years after your treatment. If Pap tests have been discontinued, your risk factors (such as having a new sexual partner) need to be reassessed to determine if screening should resume. Some women have medical problems that increase the chance of getting cervical cancer. In these cases, your health care provider may recommend more frequent screening and Pap tests. Colorectal Cancer  This type of cancer can be detected and often prevented.  Routine colorectal cancer screening usually begins at 50 years of age and continues through 49 years of age.  Your health care provider may recommend screening at an earlier age if you have risk factors for colon cancer.  Your health care provider may also recommend using home test kits to check for hidden blood in the stool.  A small camera at the end of a tube can be used to examine your colon directly (sigmoidoscopy or colonoscopy). This is done to check for the earliest forms of colorectal  cancer.  Routine screening usually begins at age 50.  Direct examination of the colon should be repeated every 5-10 years through 49 years of age. However, you may need to be screened more often if early forms of precancerous polyps or small growths are found. Skin Cancer  Check your skin from head to toe regularly.  Tell your health care provider about any new moles or changes in moles, especially if there is a change in a mole's shape or color.  Also tell your health care provider if you have a mole that is larger than the size of a pencil eraser.  Always use sunscreen. Apply sunscreen liberally and repeatedly throughout the day.  Protect yourself by wearing long sleeves, pants, a wide-brimmed hat, and sunglasses whenever you are outside. HEART DISEASE, DIABETES, AND HIGH BLOOD PRESSURE   High blood pressure causes heart disease and increases the risk of stroke. High blood pressure is more likely to develop in:  People who have blood pressure in the high end   of the normal range (130-139/85-89 mm Hg).  People who are overweight or obese.  People who are African American.  If you are 38-23 years of age, have your blood pressure checked every 3-5 years. If you are 61 years of age or older, have your blood pressure checked every year. You should have your blood pressure measured twice--once when you are at a hospital or clinic, and once when you are not at a hospital or clinic. Record the average of the two measurements. To check your blood pressure when you are not at a hospital or clinic, you can use:  An automated blood pressure machine at a pharmacy.  A home blood pressure monitor.  If you are between 45 years and 39 years old, ask your health care provider if you should take aspirin to prevent strokes.  Have regular diabetes screenings. This involves taking a blood sample to check your fasting blood sugar level.  If you are at a normal weight and have a low risk for diabetes,  have this test once every three years after 49 years of age.  If you are overweight and have a high risk for diabetes, consider being tested at a younger age or more often. PREVENTING INFECTION  Hepatitis B  If you have a higher risk for hepatitis B, you should be screened for this virus. You are considered at high risk for hepatitis B if:  You were born in a country where hepatitis B is common. Ask your health care provider which countries are considered high risk.  Your parents were born in a high-risk country, and you have not been immunized against hepatitis B (hepatitis B vaccine).  You have HIV or AIDS.  You use needles to inject street drugs.  You live with someone who has hepatitis B.  You have had sex with someone who has hepatitis B.  You get hemodialysis treatment.  You take certain medicines for conditions, including cancer, organ transplantation, and autoimmune conditions. Hepatitis C  Blood testing is recommended for:  Everyone born from [birth year] through 1964.  Anyone with known risk factors for hepatitis C. Sexually transmitted infections (STIs)  You should be screened for sexually transmitted infections (STIs) including gonorrhea and chlamydia if:  You are sexually active and are younger than 49 years of age.  You are older than 49 years of age and your health care provider tells you that you are at risk for this type of infection.  Your sexual activity has changed since you were last screened and you are at an increased risk for chlamydia or gonorrhea. Ask your health care provider if you are at risk.  If you do not have HIV, but are at risk, it may be recommended that you take a prescription medicine daily to prevent HIV infection. This is called pre-exposure prophylaxis (PrEP). You are considered at risk if:  You are sexually active and do not regularly use condoms or know the HIV status of your partner(s).  You take drugs by injection.  You are sexually  active with a partner who has HIV. Talk with your health care provider about whether you are at high risk of being infected with HIV. If you choose to begin PrEP, you should first be tested for HIV. You should then be tested every 3 months for as long as you are taking PrEP.  PREGNANCY   If you are premenopausal and you may become pregnant, ask your health care provider about preconception counseling.  If you may  become pregnant, take 400 to 800 micrograms (mcg) of folic acid every day.  If you want to prevent pregnancy, talk to your health care provider about birth control (contraception). OSTEOPOROSIS AND MENOPAUSE   Osteoporosis is a disease in which the bones lose minerals and strength with aging. This can result in serious bone fractures. Your risk for osteoporosis can be identified using a bone density scan.  If you are 61 years of age or older, or if you are at risk for osteoporosis and fractures, ask your health care provider if you should be screened.  Ask your health care provider whether you should take a calcium or vitamin D supplement to lower your risk for osteoporosis.  Menopause may have certain physical symptoms and risks.  Hormone replacement therapy may reduce some of these symptoms and risks. Talk to your health care provider about whether hormone replacement therapy is right for you.  HOME CARE INSTRUCTIONS   Schedule regular health, dental, and eye exams.  Stay current with your immunizations.   Do not use any tobacco products including cigarettes, chewing tobacco, or electronic cigarettes.  If you are pregnant, do not drink alcohol.  If you are breastfeeding, limit how much and how often you drink alcohol.  Limit alcohol intake to no more than 1 drink per day for nonpregnant women. One drink equals 12 ounces of beer, 5 ounces of wine, or 1 ounces of hard liquor.  Do not use street drugs.  Do not share needles.  Ask your health care provider for help if  you need support or information about quitting drugs.  Tell your health care provider if you often feel depressed.  Tell your health care provider if you have ever been abused or do not feel safe at home.   This information is not intended to replace advice given to you by your health care provider. Make sure you discuss any questions you have with your health care provider.   Document Released: 02/10/2011 Document Revised: 08/18/2014 Document Reviewed: 06/29/2013 Elsevier Interactive Patient Education [employer].

## 2016-02-15 LAB — STD PANEL
HEP B S AG: NEGATIVE
HIV 1&2 Ab, 4th Generation: NONREACTIVE

## 2016-02-15 LAB — GC/CHLAMYDIA PROBE AMP
CT PROBE, AMP APTIMA: NOT DETECTED
GC Probe RNA: NOT DETECTED

## 2016-02-15 LAB — HEPATITIS C ANTIBODY: HCV AB: NEGATIVE

## 2016-02-16 ENCOUNTER — Encounter: Payer: Self-pay | Admitting: Obstetrics and Gynecology

## 2016-02-16 ENCOUNTER — Other Ambulatory Visit: Payer: Self-pay | Admitting: Obstetrics and Gynecology

## 2016-02-16 DIAGNOSIS — R7989 Other specified abnormal findings of blood chemistry: Secondary | ICD-10-CM

## 2016-02-18 ENCOUNTER — Encounter: Payer: Self-pay | Admitting: Obstetrics and Gynecology

## 2016-02-18 ENCOUNTER — Telehealth: Payer: Self-pay | Admitting: Obstetrics and Gynecology

## 2016-02-18 ENCOUNTER — Other Ambulatory Visit: Payer: Self-pay | Admitting: Obstetrics and Gynecology

## 2016-02-18 DIAGNOSIS — R7989 Other specified abnormal findings of blood chemistry: Secondary | ICD-10-CM

## 2016-02-18 LAB — LIPID PANEL
CHOL/HDL RATIO: 1.6 ratio (ref <=5.0)
Cholesterol: 202 mg/dL — ABNORMAL HIGH (ref 125–200)
HDL: 130 mg/dL (ref >=46)
LDL Cholesterol: 52 mg/dL (ref <130)
Triglycerides: 100 mg/dL (ref <150)
VLDL: 20 mg/dL (ref <30)

## 2016-02-18 LAB — COMPREHENSIVE METABOLIC PANEL
ALBUMIN: 3.7 g/dL (ref 3.6–5.1)
ALT: 14 U/L (ref 6–29)
AST: 20 U/L (ref 10–35)
Alkaline Phosphatase: 35 U/L (ref 33–115)
BILIRUBIN TOTAL: 0.5 mg/dL (ref 0.2–1.2)
BUN: 13 mg/dL (ref 7–25)
CO2: 26 mmol/L (ref 20–31)
CREATININE: 1.03 mg/dL (ref 0.50–1.10)
Calcium: 8.9 mg/dL (ref 8.6–10.2)
Chloride: 106 mmol/L (ref 98–110)
GLUCOSE: 85 mg/dL (ref 65–99)
Potassium: 4.2 mmol/L (ref 3.5–5.3)
SODIUM: 140 mmol/L (ref 135–146)
Total Protein: 6.5 g/dL (ref 6.1–8.1)

## 2016-02-18 NOTE — Telephone Encounter (Signed)
-----   Message from Nunzio Cobbs, MD sent at 02/16/2016  9:33 PM EDT ----- Results to patient through My Chart. Please contact the patient in follow up to this message regarding her elevated creatinine.  I am placing an order for a recheck of her BMP at the end of this week.   "Hello Marie Avila,   I am releasing your test results to you.    The testing for infectious disease is negative for HIV, syphilis, hepatitis B and C, and gonorrhea and chlamydia.  Your cholesterol is normal and looks great due to your high HDL cholesterol.    The blood chemistry testing showed an elevated creatinine, which will need to be retested this week to understand if this is real or not.  If the creatine is elevated, we will need to coordinate with your primary care provider to have you see a kidney specialist.   Please make an appointment for a lab recheck at the end of the week.   The nurse from the office will call in follow up to this note.  Josefa Half, MD"  Cc- Marisa Sprinkles

## 2016-02-18 NOTE — Telephone Encounter (Signed)
Patient has some questions regarding her recent lab results.

## 2016-02-18 NOTE — Telephone Encounter (Signed)
Return call to patient.  She scheduled follow up lab appointment for Thursday 02/18/16.   She is requesting at this time to start process for referral to Nephrology as she has concerns about elevated creatinine. She agrees to recheck but requests to start process now.  She also is wondering if she should leave a urine sample and check for urine specific gravity.   She denies any urinary changes.   Advised will send message to Dr. Quincy Simmonds and return call. Patient agrees.

## 2016-02-18 NOTE — Telephone Encounter (Signed)
I will place a referral to nephrology - Dr. Arty Baumgartner.

## 2016-02-19 NOTE — Telephone Encounter (Signed)
Patient sent mychart message to Dr. Quincy Simmonds and I responded to patient via mychart and telephone:   I spoke with the nurse today, 02/18/2016 and would like to go ahead a get a renal consult. I think a creatinine of 2.5 with a normal BUN is very alarming and as I mentioned to your nurse, I have been getting daily lower extremity edema which I did not consider previously. Whether my creatinine comes down or not,I would want follow up as this can not be a good sign as I am not taking medications and do not eat meat. Please call or message me with your opinion on this. Your nurse indicated that I may need to see my primary MD first? If so it might be better to discuss with him first in case further lab studies are needed. Again, I would really like to address this as soon as possible to avoid further kidney damage.

## 2016-02-19 NOTE — Telephone Encounter (Signed)
I have ordered a metabolic profile for this week which will look at her blood chemistries, BUN and creatinine.  This can be done at any time, including today.  The change in her creatinine was such a change from her prior level, that I thought is important to recheck it to be sure that this is a real result for her.  If elevated, patient will need to be seen in a timely manner for her nephrology consultation.  Her PCP can review any labs in EPIC and can be CC'd with any consultation reports.  I hope this helps.

## 2016-02-19 NOTE — Telephone Encounter (Signed)
Return call to patient and she is advised that Dr. Quincy Simmonds is aware of her concerns and has placed a referral for nephrology to Dr. Marval Regal.   Advised that I have sent referral this morning, however, [facility] does triage referrals and will let us know in what time frame she will be scheduled. She verbalizes understanding.  Has lab recheck appointment on 02/21/16.   Patient notified that I have responded to her mychart message and also encouraged follow up with her PCP as well.   Routing to Dr. Quincy Simmonds to update.

## 2016-02-20 ENCOUNTER — Ambulatory Visit (INDEPENDENT_AMBULATORY_CARE_PROVIDER_SITE_OTHER): Payer: Managed Care, Other (non HMO) | Admitting: Sports Medicine

## 2016-02-20 ENCOUNTER — Other Ambulatory Visit: Payer: Managed Care, Other (non HMO)

## 2016-02-20 ENCOUNTER — Encounter: Payer: Self-pay | Admitting: Sports Medicine

## 2016-02-20 VITALS — BP 110/78 | Ht 67.5 in | Wt 126.0 lb

## 2016-02-20 DIAGNOSIS — M722 Plantar fascial fibromatosis: Secondary | ICD-10-CM | POA: Diagnosis not present

## 2016-02-20 NOTE — Progress Notes (Signed)
   Subjective:    Patient ID: Marie Avila, female    DOB: November 02, 1965, 50 y.o.   MRN: [id number]  HPI chief complaint: Left heel pain  Very pleasant 50 year old female comes in today complaining of 6 months of left heel pain. She has a history of plantar fasciitis in the right heel which was treated with conservative treatment with excellent results in 2012. Her pain resolved at that time with 3 weeks of rest away from running. She has not had any problems with her right heel since. Her left heel pain started without any known trauma. It is similar in nature to what she experienced previously but it is more severe. It tends to be worse as the day goes on. Up until recently she was able to continue running but 2 weeks ago she had pain 4 miles into her run which caused her to limp for couple days afterwards. She denies any numbness or tingling. She has been trying some icing. She has been doing a few plantar fascial stretches. She has orthotics in her running shoes but they are quite worn. They are also quite rigid at the heel. She has taken oral anti-inflammatories in the past with good results but was recently notified by her PCP that her creatinine was elevated. In fact, she has a follow-up appointment with them later today.    Review of Systems    as above Objective:   Physical Exam  Well-developed, fit appearing. No acute distress. Awake alert and oriented 3. Vital signs reviewed  Left heel: There is tenderness to palpation at the calcaneal origin of the plantar fascia. No pain with calcaneal squeeze. No soft tissue swelling. Pes planus with standing, right greater than left.  Evaluation of her running gait shows excellent form. She is running without a limp. She is a forefoot striker.      Assessment & Plan:   Plantar fasciitis, left foot  Patient needs a new pair of well cushioned orthotics. She will return to the office next week for me to construct those. She will continue with  daily icing and we went over some calf stretching and proper plantar fascial stretching. She has tried an arch strap in the past without any pain relief so we will not repeat that. I think she is okay to continue with activity using pain as her guide but I do not want her to return to running until after we make her new orthotics.

## 2016-02-20 NOTE — Telephone Encounter (Signed)
Patient called. She has just seen a [facility] Provider related to her planter fascitis.  He reviewed lab work and noted that a corrected level has been entered.  Result  Lab Results  Component Value Date   CREATININE 1.03 02/14/2016   She is very glad this is now normal.  She wishes to cancel today's lab appointment. I have done so, however, advised that will review with Dr. Quincy Simmonds to ensure she does not need to have a recheck or can wait until next year for recheck. Patient states she is okay for recheck in one year if Dr. Quincy Simmonds agrees. Advised will call her back if Dr. Quincy Simmonds would like her to have recheck earlier than one year. Patient agreeable and expresses thanks to Dr. Quincy Simmonds for her assistance.   Okay to cancel referral to Nephrology?

## 2016-02-20 NOTE — Telephone Encounter (Signed)
Call to patient and discussed message from Dr. Quincy Simmonds.  She has been thinking, she has recently increased use of Advil due to plantar fasciitis and has stopped since abnormal lab results.  Patient is a vegetarian and works out every other day.  She is agreeable to lab appointment today and this is scheduled. She asks if Dr. Quincy Simmonds with authorize another test, CKMB to test for muscle breakdown as reason for elevated creatinine.   Advised will send message to Dr. Quincy Simmonds

## 2016-02-20 NOTE — Telephone Encounter (Signed)
Great that the level has returned back in a normal range.  This did not come through [facility] results in a corrected fashion.  The lab has sent a paper copy only.   I am perfectly fine with her checking this in one year.   You may cancel the nephrology referral and upcoming BMP.   Cc- Lamont Snowball

## 2016-02-21 ENCOUNTER — Other Ambulatory Visit: Payer: Managed Care, Other (non HMO)

## 2016-02-21 NOTE — Telephone Encounter (Addendum)
Call to [facility] and left message on voicemail for referrals coordinator, Marzetta Board, to advise that we need to cancel referral as patient's labs have been re-resulted to normal. Left message to call back with any questions.  Routing to provider for final review.   Will close encounter.

## 2016-02-29 ENCOUNTER — Encounter: Payer: Managed Care, Other (non HMO) | Admitting: Sports Medicine

## 2016-03-04 ENCOUNTER — Ambulatory Visit (INDEPENDENT_AMBULATORY_CARE_PROVIDER_SITE_OTHER): Payer: Managed Care, Other (non HMO) | Admitting: Sports Medicine

## 2016-03-04 ENCOUNTER — Encounter: Payer: Self-pay | Admitting: Sports Medicine

## 2016-03-04 VITALS — BP 108/71 | HR 69 | Ht 67.5 in | Wt 126.0 lb

## 2016-03-04 DIAGNOSIS — M722 Plantar fascial fibromatosis: Secondary | ICD-10-CM

## 2016-03-04 NOTE — Progress Notes (Signed)
Patient ID: Marie Avila, female   DOB: 1965-12-06, 50 y.o.   MRN: [id number]  Patient comes in today for custom orthotics. Please see my previous office note for history and physical exam findings regarding her plantar fasciitis.  Total time spent with the patient today was 30 minutes with greater than 50% of the time spent in face-to-face consultation discussing orthotic construction, instruction, and fitting. Patient found the orthotics to be comfortable prior to leaving the office. She will continue with her plantar fascial stretches and daily icing. Resume activity as tolerated and follow-up with me as needed.  Patient was fitted for a : standard, cushioned, semi-rigid orthotic. The orthotic was heated and afterward the patient stood on the orthotic blank positioned on the orthotic stand. The patient was positioned in subtalar neutral position and 10 degrees of ankle dorsiflexion in a weight bearing stance. After completion of molding, a stable base was applied to the orthotic blank. The blank was ground to a stable position for weight bearing. Size: 8 Base: Blue EVA Posting: none Additional orthotic padding: none

## 2016-05-12 ENCOUNTER — Ambulatory Visit (INDEPENDENT_AMBULATORY_CARE_PROVIDER_SITE_OTHER): Payer: Managed Care, Other (non HMO) | Admitting: Sports Medicine

## 2016-05-12 ENCOUNTER — Encounter: Payer: Self-pay | Admitting: Sports Medicine

## 2016-05-12 ENCOUNTER — Ambulatory Visit
Admission: RE | Admit: 2016-05-12 | Discharge: 2016-05-12 | Disposition: A | Discharge status: Home / Self Care | Payer: Managed Care, Other (non HMO) | Source: Ambulatory Visit | Attending: Sports Medicine | Admitting: Sports Medicine

## 2016-05-12 VITALS — BP 117/76 | Ht 67.0 in | Wt 126.0 lb

## 2016-05-12 DIAGNOSIS — M79672 Pain in left foot: Secondary | ICD-10-CM

## 2016-05-12 NOTE — Progress Notes (Signed)
   Subjective:    Patient ID: Marie Avila, female    DOB: [birth year], 50 y.o.   MRN: [id number]  HPI patient comes in today with persistent left heel pain. Symptoms have been present now for 4-5 months. She has a history of plantar fasciitis in the right foot but states that her current pain is different in nature than what she experienced previously. She is complaining of heel pain which is worse with prolonged ambulation and especially with running. She has no pain with first getting up in the morning. No pain in the arch of her foot. No numbness or tingling. We made her some custom orthotics back in July but they have not helped. She has resumed wearing an off-the-shelf orthotic which seems to be more comfortable for her.    Review of Systems     Objective:   Physical Exam Well-developed, well-nourished. No acute distress  Left foot: There is callus buildup over the medial aspect of the heel. Patient is tender to palpation in this area. Negative calcaneal squeeze. Pronation with standing. No pain with plantar fascial stretching or with Achilles stretching. Neurovascularly intact distally. Walking without a limp.       Assessment & Plan:   Persistent left heel pain likely secondary to plantar fasciitis  I'm going to get an x-ray of her left calcaneus to see if she has a prominent bone spur. We also discussed the possibility of evaluating this further with an MRI (I provided her with with the name of 3 separate imaging locations here in [place] for her to research). I've added a small 3/16 inch felt pad to the heel of all of her orthotics. I will call her after I review her x-rays. In lieu of the MRI, we may want to schedule a diagnostic plantar fascial cortisone injection sometime in the next week or two.

## 2016-05-13 ENCOUNTER — Telehealth: Payer: Self-pay | Admitting: Sports Medicine

## 2016-05-13 NOTE — Telephone Encounter (Signed)
  I spoke with the patient on the phone today regarding x-rays of her left calcaneus. They are unremarkable. No obvious calcaneal spur. No signs of stress fracture. We will schedule the patient to return to the office sometime next week for a diagnostic/therapeutic injection into the plantar fascia.

## 2016-05-20 ENCOUNTER — Encounter: Payer: Self-pay | Admitting: Family Medicine

## 2016-05-20 ENCOUNTER — Ambulatory Visit (INDEPENDENT_AMBULATORY_CARE_PROVIDER_SITE_OTHER): Payer: Managed Care, Other (non HMO) | Admitting: Family Medicine

## 2016-05-20 VITALS — BP 111/70 | HR 63 | Ht 67.0 in | Wt 125.0 lb

## 2016-05-20 DIAGNOSIS — M79672 Pain in left foot: Secondary | ICD-10-CM | POA: Diagnosis not present

## 2016-05-20 MED ORDER — METHYLPREDNISOLONE ACETATE 40 MG/ML IJ SUSP
20.0000 mg | Freq: Once | INTRAMUSCULAR | Status: AC
  Administered 2016-05-20: 20 mg via INTRA_ARTICULAR

## 2016-05-20 NOTE — Progress Notes (Signed)
  Marie Avila - 50 y.o. female MRN [id number]  Date of birth: 15-Nov-1965  SUBJECTIVE:  Including CC & ROS.  Chief Complaint  Patient presents with  . Foot Pain   Marie Avila is a 50 yo F that is presenting for injection for left heel joint. This is to be a diagnostic injection. She will follow up with Dr. Micheline Chapman after injection today.    PHYSICAL EXAM:  VS: BP:111/70  HR:63bpm  TEMP: ( )  RESP:   HT:5\' 7"  (170.2 cm)   WT:125 lb (56.7 kg)  BMI:19.6  Aspiration/Injection Procedure Note Marie Avila 09-30-1965  Procedure: Injection Indications: Plantar fasciitis   Procedure Details Consent: Risks of procedure as well as the alternatives and risks of each were explained to the (patient/caregiver).  Consent for procedure obtained. Time Out: Verified patient identification, verified procedure, site/side was marked, verified correct patient position, special equipment/implants available, medications/allergies/relevent history reviewed, required imaging and test results available.  Performed.  The area was cleaned with iodine and alcohol swabs.    The left plantar fascia was injected using 0.5 cc's of 40 mg Depomedrol and 1 cc's of 1% lidocaine with a 25 1 1/2" needle.  Ultrasound was used. Images were obtained in Transverse and Long views showing the injection.    A sterile dressing was applied.  Patient did tolerate procedure well.   ASSESSMENT & PLAN:   Pain of left heel Injection of the plantar fascia today. She will follow up with Dr. Micheline Chapman

## 2016-05-20 NOTE — Assessment & Plan Note (Signed)
Injection of the plantar fascia today. She will follow up with Dr. Micheline Chapman

## 2016-06-23 ENCOUNTER — Ambulatory Visit: Payer: Managed Care, Other (non HMO) | Admitting: Sports Medicine

## 2016-07-14 ENCOUNTER — Ambulatory Visit
Admission: RE | Admit: 2016-07-14 | Discharge: 2016-07-14 | Disposition: A | Discharge status: Home / Self Care | Payer: Managed Care, Other (non HMO) | Source: Ambulatory Visit | Attending: Obstetrics and Gynecology | Admitting: Obstetrics and Gynecology

## 2016-07-14 ENCOUNTER — Other Ambulatory Visit: Payer: Self-pay | Admitting: Obstetrics and Gynecology

## 2016-07-14 DIAGNOSIS — Z1231 Encounter for screening mammogram for malignant neoplasm of breast: Secondary | ICD-10-CM

## 2016-09-08 ENCOUNTER — Telehealth: Payer: Self-pay | Admitting: Obstetrics and Gynecology

## 2016-09-08 NOTE — Telephone Encounter (Signed)
Patient is calling because the pharmacy told her she does not have any more refills on her birth control. She is using [facility] [address] [phone]

## 2016-09-08 NOTE — Telephone Encounter (Signed)
Spoke with pharmacy and they advised that she does have refills available. She is just not able to pick it up until tomorrow (1/30). Called patient back and advised this information and she verbalized understanding.

## 2017-02-02 ENCOUNTER — Encounter: Payer: Self-pay | Admitting: Nurse Practitioner

## 2017-02-02 ENCOUNTER — Other Ambulatory Visit: Payer: Self-pay | Admitting: Nurse Practitioner

## 2017-02-02 MED ORDER — NORGESTIM-ETH ESTRAD TRIPHASIC 0.18/0.215/0.25 MG-35 MCG PO TABS: 1.0000 | ORAL_TABLET | Freq: Every day | ORAL | 0 refills | Status: DC

## 2017-02-09 ENCOUNTER — Telehealth: Payer: Self-pay | Admitting: Nurse Practitioner

## 2017-02-09 NOTE — Telephone Encounter (Signed)
Left message regarding upcoming appointment has been canceled and needs to be rescheduled. °

## 2017-02-18 ENCOUNTER — Telehealth: Payer: Self-pay | Admitting: Certified Nurse Midwife

## 2017-02-18 NOTE — Telephone Encounter (Signed)
Left patient a message to call back to reschedule a future appointment that was cancelled by the provider for AEX. °

## 2017-02-20 ENCOUNTER — Other Ambulatory Visit: Payer: Self-pay | Admitting: Obstetrics and Gynecology

## 2017-02-20 NOTE — Telephone Encounter (Signed)
eScribe request from [facility] for refill on TRI-PREVIFEM Last filled - 02/02/17, 1 pack with 0 refills Last AEX - 02/14/16 Dr. Quincy Simmonds Next AEX - not scheduled (provider cancelled) Last MMG - 07/14/16, Bi-Rads 1:  Negative Please advise refills today.   Message left to return call to schedule annual exam. [phone])

## 2017-02-23 NOTE — Telephone Encounter (Signed)
Message left for patient to return call to schedule annual exam.

## 2017-02-27 ENCOUNTER — Ambulatory Visit: Payer: Managed Care, Other (non HMO) | Admitting: Obstetrics and Gynecology

## 2017-03-05 ENCOUNTER — Other Ambulatory Visit: Payer: Self-pay | Admitting: Obstetrics and Gynecology

## 2017-03-09 ENCOUNTER — Ambulatory Visit: Payer: Managed Care, Other (non HMO) | Admitting: Nurse Practitioner

## 2017-03-19 ENCOUNTER — Ambulatory Visit: Payer: Managed Care, Other (non HMO) | Admitting: Certified Nurse Midwife
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
## 2017-04-15 ENCOUNTER — Ambulatory Visit (INDEPENDENT_AMBULATORY_CARE_PROVIDER_SITE_OTHER): Payer: 59 | Admitting: Certified Nurse Midwife

## 2017-04-15 ENCOUNTER — Other Ambulatory Visit (HOSPITAL_COMMUNITY)
Admission: RE | Admit: 2017-04-15 | Discharge: 2017-04-15 | Disposition: A | Discharge status: Home / Self Care | Payer: 59 | Source: Ambulatory Visit | Attending: Certified Nurse Midwife | Admitting: Certified Nurse Midwife

## 2017-04-15 ENCOUNTER — Encounter: Payer: Self-pay | Admitting: Certified Nurse Midwife

## 2017-04-15 VITALS — BP 110/72 | HR 68 | Resp 16 | Ht 67.5 in | Wt 128.0 lb

## 2017-04-15 DIAGNOSIS — Z0000 Encounter for general adult medical examination without abnormal findings: Secondary | ICD-10-CM | POA: Diagnosis not present

## 2017-04-15 DIAGNOSIS — Z124 Encounter for screening for malignant neoplasm of cervix: Secondary | ICD-10-CM | POA: Diagnosis not present

## 2017-04-15 DIAGNOSIS — Z01419 Encounter for gynecological examination (general) (routine) without abnormal findings: Secondary | ICD-10-CM | POA: Diagnosis not present

## 2017-04-15 DIAGNOSIS — Z3041 Encounter for surveillance of contraceptive pills: Secondary | ICD-10-CM | POA: Diagnosis not present

## 2017-04-15 DIAGNOSIS — Z1211 Encounter for screening for malignant neoplasm of colon: Secondary | ICD-10-CM

## 2017-04-15 MED ORDER — NORGESTIM-ETH ESTRAD TRIPHASIC 0.18/0.215/0.25 MG-35 MCG PO TABS: 1.0000 | ORAL_TABLET | Freq: Every day | ORAL | 4 refills | Status: DC

## 2017-04-15 NOTE — Patient Instructions (Signed)

## 2017-04-16 LAB — LIPID PANEL
CHOL/HDL RATIO: 1.5 ratio (ref 0.0–4.4)
Cholesterol, Total: 217 mg/dL — ABNORMAL HIGH (ref 100–199)
HDL: 141 mg/dL (ref >39)
LDL Calculated: 53 mg/dL (ref 0–99)
Triglycerides: 117 mg/dL (ref 0–149)
VLDL Cholesterol Cal: 23 mg/dL (ref 5–40)

## 2017-04-16 LAB — CYTOLOGY - PAP
Diagnosis: NEGATIVE
HPV (WINDOPATH): NOT DETECTED

## 2017-04-16 LAB — VITAMIN D 25 HYDROXY (VIT D DEFICIENCY, FRACTURES): VIT D 25 HYDROXY: 55.5 ng/mL (ref 30.0–100.0)

## 2017-04-16 LAB — TSH: TSH: 2.29 u[IU]/mL (ref 0.450–4.500)

## 2017-07-14 DIAGNOSIS — J3501 Chronic tonsillitis: Secondary | ICD-10-CM | POA: Insufficient documentation

## 2017-07-14 DIAGNOSIS — R220 Localized swelling, mass and lump, head: Secondary | ICD-10-CM | POA: Insufficient documentation

## 2017-08-12 ENCOUNTER — Other Ambulatory Visit: Payer: Self-pay | Admitting: Obstetrics and Gynecology

## 2017-08-12 DIAGNOSIS — Z1231 Encounter for screening mammogram for malignant neoplasm of breast: Secondary | ICD-10-CM

## 2017-08-26 ENCOUNTER — Telehealth: Payer: Self-pay | Admitting: Certified Nurse Midwife

## 2017-08-26 NOTE — Telephone Encounter (Signed)
Patient thinks she has a vaginal infection.  States she has bumps and no other symptoms.

## 2017-08-26 NOTE — Telephone Encounter (Signed)
Spoke with patient. Patient reports multiple flesh colored bumps on vulva. Noticed this week, has had one in the past, now several.   Describes as flesh colored. No d/c, pain or redness.   Denies any other symptoms.   No new partners.   Recommended OV for further evaluation. OV scheduled for 08/28/17 at 1pm with Melvia Heaps, CNM. Patient declined earlier appt offered. Patient is agreeable to date and time.   Routing to provider for final review. Patient is agreeable to disposition. Will close encounter.

## 2017-08-28 ENCOUNTER — Encounter: Payer: Self-pay | Admitting: Certified Nurse Midwife

## 2017-08-28 ENCOUNTER — Other Ambulatory Visit: Payer: Self-pay

## 2017-08-28 ENCOUNTER — Ambulatory Visit (INDEPENDENT_AMBULATORY_CARE_PROVIDER_SITE_OTHER): Payer: 59 | Admitting: Certified Nurse Midwife

## 2017-08-28 VITALS — BP 100/68 | HR 64 | Resp 16 | Ht 67.25 in | Wt 128.0 lb

## 2017-08-28 DIAGNOSIS — Z01419 Encounter for gynecological examination (general) (routine) without abnormal findings: Secondary | ICD-10-CM | POA: Diagnosis not present

## 2017-08-28 NOTE — Progress Notes (Signed)
Subjective:     Patient ID: Marie Avila, female   DOB: 1965-09-04, 52 y.o.   MRN: [id number]  52 yo gopo here complaint of some type of bumps in vaginal area in the past week and concerned that these are warts. No history of genital warts or partner history. Had never looked at herself before and feels they are everywhere. Denies vaginal discharge change or odors.     Review of Systems  Constitutional: Negative.   Gastrointestinal: Negative.   Genitourinary: Negative for genital sores, pelvic pain, vaginal bleeding, vaginal discharge and vaginal pain.       Noted ? Genital warts 4 days ago and now not as many or not sure.       Objective:   Physical Exam  Constitutional: She is oriented to person, place, and time. She appears well-developed and well-nourished.  Abdominal: Soft. There is no tenderness.  Genitourinary: Vagina normal.    There is no rash, tenderness, lesion or injury on the right labia. There is no rash, tenderness, lesion or injury on the left labia. No tenderness in the vagina. No vaginal discharge found.  Lymphadenopathy:       Right: No inguinal adenopathy present.       Left: No inguinal adenopathy present.  Neurological: She is alert and oriented to person, place, and time.  Skin: Skin is warm and dry.  Psychiatric: She has a normal mood and affect. Her behavior is normal. Judgment and thought content normal.       Assessment:     Normal external vulva and vaginal, perineal exam No evidence of genital wart appearance     Plan:     Discussed finding with patient. Patient reassured. Discussed and shown normal tissue appearance. Questions addressed. Patient appreciative of kindness with exam.  Rv prn

## 2017-09-01 ENCOUNTER — Ambulatory Visit
Admission: RE | Admit: 2017-09-01 | Discharge: 2017-09-01 | Disposition: A | Discharge status: Home / Self Care | Payer: 59 | Source: Ambulatory Visit | Attending: Obstetrics and Gynecology | Admitting: Obstetrics and Gynecology

## 2017-09-01 DIAGNOSIS — Z1231 Encounter for screening mammogram for malignant neoplasm of breast: Secondary | ICD-10-CM

## 2018-03-15 ENCOUNTER — Encounter: Payer: Self-pay | Admitting: Physician Assistant

## 2018-03-15 ENCOUNTER — Ambulatory Visit (INDEPENDENT_AMBULATORY_CARE_PROVIDER_SITE_OTHER): Payer: 59 | Admitting: Physician Assistant

## 2018-03-15 ENCOUNTER — Other Ambulatory Visit: Payer: Self-pay

## 2018-03-15 VITALS — BP 119/77 | HR 62 | Temp 98.3°F | Resp 18 | Ht 67.0 in | Wt 127.0 lb

## 2018-03-15 DIAGNOSIS — Z832 Family history of diseases of the blood and blood-forming organs and certain disorders involving the immune mechanism: Secondary | ICD-10-CM

## 2018-03-15 DIAGNOSIS — Z0000 Encounter for general adult medical examination without abnormal findings: Secondary | ICD-10-CM

## 2018-03-15 DIAGNOSIS — Z13228 Encounter for screening for other metabolic disorders: Secondary | ICD-10-CM | POA: Diagnosis not present

## 2018-03-15 DIAGNOSIS — Z130 Encounter for screening for diseases of the blood and blood-forming organs and certain disorders involving the immune mechanism: Secondary | ICD-10-CM | POA: Diagnosis not present

## 2018-03-15 DIAGNOSIS — Z1329 Encounter for screening for other suspected endocrine disorder: Secondary | ICD-10-CM | POA: Diagnosis not present

## 2018-03-15 DIAGNOSIS — R011 Cardiac murmur, unspecified: Secondary | ICD-10-CM

## 2018-03-15 LAB — POCT URINALYSIS DIP (MANUAL ENTRY)
Bilirubin, UA: NEGATIVE
Blood, UA: NEGATIVE
Glucose, UA: NEGATIVE mg/dL
Ketones, POC UA: NEGATIVE mg/dL
Leukocytes, UA: NEGATIVE
Nitrite, UA: NEGATIVE
Protein Ur, POC: NEGATIVE mg/dL
Spec Grav, UA: 1.01 (ref 1.010–1.025)
Urobilinogen, UA: 0.2 U/dL
pH, UA: 7 (ref 5.0–8.0)

## 2018-03-15 NOTE — Patient Instructions (Addendum)
You will receive a phone call to schedule an appointment with cardiology. (in 1-2 weeks) We will contact you with the results of your lab work.   Thank you for allowing me to take part in caring for your health.   Health Maintenance, Female Adopting a healthy lifestyle and getting preventive care can go a long way to promote health and wellness. Talk with your health care provider about what schedule of regular examinations is right for you. This is a good chance for you to check in with your provider about disease prevention and staying healthy. In between checkups, there are plenty of things you can do on your own. Experts have done a lot of research about which lifestyle changes and preventive measures are most likely to keep you healthy. Ask your health care provider for more information. Weight and diet Eat a healthy diet  Be sure to include plenty of vegetables, fruits, low-fat dairy products, and lean protein.  Do not eat a lot of foods high in solid fats, added sugars, or salt.  Get regular exercise. This is one of the most important things you can do for your health. ? Most adults should exercise for at least 150 minutes each week. The exercise should increase your heart rate and make you sweat (moderate-intensity exercise). ? Most adults should also do strengthening exercises at least twice a week. This is in addition to the moderate-intensity exercise.  Maintain a healthy weight  Body mass index (BMI) is a measurement that can be used to identify possible weight problems. It estimates body fat based on height and weight. Your health care provider can help determine your BMI and help you achieve or maintain a healthy weight.  For females 23 years of age and older: ? A BMI below 18.5 is considered underweight. ? A BMI of 18.5 to 24.9 is normal. ? A BMI of 25 to 29.9 is considered overweight. ? A BMI of 30 and above is considered obese.  Watch levels of cholesterol and blood  lipids  You should start having your blood tested for lipids and cholesterol at 51 years of age, then have this test every 5 years.  You may need to have your cholesterol levels checked more often if: ? Your lipid or cholesterol levels are high. ? You are older than 51 years of age. ? You are at high risk for heart disease.  Cancer screening Lung Cancer  Lung cancer screening is recommended for adults 63-26 years old who are at high risk for lung cancer because of a history of smoking.  A yearly low-dose CT scan of the lungs is recommended for people who: ? Currently smoke. ? Have quit within the past 15 years. ? Have at least a 30-pack-year history of smoking. A pack year is smoking an average of one pack of cigarettes a day for 1 year.  Yearly screening should continue until it has been 15 years since you quit.  Yearly screening should stop if you develop a health problem that would prevent you from having lung cancer treatment.  Breast Cancer  Practice breast self-awareness. This means understanding how your breasts normally appear and feel.  It also means doing regular breast self-exams. Let your health care provider know about any changes, no matter how small.  If you are in your 20s or 30s, you should have a clinical breast exam (CBE) by a health care provider every 1-3 years as part of a regular health exam.  If you are  12 or older, have a CBE every year. Also consider having a breast X-ray (mammogram) every year.  If you have a family history of breast cancer, talk to your health care provider about genetic screening.  If you are at high risk for breast cancer, talk to your health care provider about having an MRI and a mammogram every year.  Breast cancer gene (BRCA) assessment is recommended for women who have family members with BRCA-related cancers. BRCA-related cancers include: ? Breast. ? Ovarian. ? Tubal. ? Peritoneal cancers.  Results of the assessment will  determine the need for genetic counseling and BRCA1 and BRCA2 testing.  Cervical Cancer Your health care provider may recommend that you be screened regularly for cancer of the pelvic organs (ovaries, uterus, and vagina). This screening involves a pelvic examination, including checking for microscopic changes to the surface of your cervix (Pap test). You may be encouraged to have this screening done every 3 years, beginning at age 85.  For women ages 47-65, health care providers may recommend pelvic exams and Pap testing every 3 years, or they may recommend the Pap and pelvic exam, combined with testing for human papilloma virus (HPV), every 5 years. Some types of HPV increase your risk of cervical cancer. Testing for HPV may also be done on women of any age with unclear Pap test results.  Other health care providers may not recommend any screening for nonpregnant women who are considered low risk for pelvic cancer and who do not have symptoms. Ask your health care provider if a screening pelvic exam is right for you.  If you have had past treatment for cervical cancer or a condition that could lead to cancer, you need Pap tests and screening for cancer for at least 20 years after your treatment. If Pap tests have been discontinued, your risk factors (such as having a new sexual partner) need to be reassessed to determine if screening should resume. Some women have medical problems that increase the chance of getting cervical cancer. In these cases, your health care provider may recommend more frequent screening and Pap tests.  Colorectal Cancer  This type of cancer can be detected and often prevented.  Routine colorectal cancer screening usually begins at 51 years of age and continues through 51 years of age.  Your health care provider may recommend screening at an earlier age if you have risk factors for colon cancer.  Your health care provider may also recommend using home test kits to check  for hidden blood in the stool.  A small camera at the end of a tube can be used to examine your colon directly (sigmoidoscopy or colonoscopy). This is done to check for the earliest forms of colorectal cancer.  Routine screening usually begins at age 67.  Direct examination of the colon should be repeated every 5-10 years through 51 years of age. However, you may need to be screened more often if early forms of precancerous polyps or small growths are found.  Skin Cancer  Check your skin from head to toe regularly.  Tell your health care provider about any new moles or changes in moles, especially if there is a change in a mole's shape or color.  Also tell your health care provider if you have a mole that is larger than the size of a pencil eraser.  Always use sunscreen. Apply sunscreen liberally and repeatedly throughout the day.  Protect yourself by wearing long sleeves, pants, a wide-brimmed hat, and sunglasses whenever you  are outside.  Heart disease, diabetes, and high blood pressure  High blood pressure causes heart disease and increases the risk of stroke. High blood pressure is more likely to develop in: ? People who have blood pressure in the high end of the normal range (130-139/85-89 mm Hg). ? People who are overweight or obese. ? People who are African American.  If you are 77-66 years of age, have your blood pressure checked every 3-5 years. If you are 46 years of age or older, have your blood pressure checked every year. You should have your blood pressure measured twice-once when you are at a hospital or clinic, and once when you are not at a hospital or clinic. Record the average of the two measurements. To check your blood pressure when you are not at a hospital or clinic, you can use: ? An automated blood pressure machine at a pharmacy. ? A home blood pressure monitor.  If you are between 37 years and 59 years old, ask your health care provider if you should take  aspirin to prevent strokes.  Have regular diabetes screenings. This involves taking a blood sample to check your fasting blood sugar level. ? If you are at a normal weight and have a low risk for diabetes, have this test once every three years after 51 years of age. ? If you are overweight and have a high risk for diabetes, consider being tested at a younger age or more often. Preventing infection Hepatitis B  If you have a higher risk for hepatitis B, you should be screened for this virus. You are considered at high risk for hepatitis B if: ? You were born in a country where hepatitis B is common. Ask your health care provider which countries are considered high risk. ? Your parents were born in a high-risk country, and you have not been immunized against hepatitis B (hepatitis B vaccine). ? You have HIV or AIDS. ? You use needles to inject street drugs. ? You live with someone who has hepatitis B. ? You have had sex with someone who has hepatitis B. ? You get hemodialysis treatment. ? You take certain medicines for conditions, including cancer, organ transplantation, and autoimmune conditions.  Hepatitis C  Blood testing is recommended for: ? Everyone born from [birth year] through 1964. ? Anyone with known risk factors for hepatitis C.  Sexually transmitted infections (STIs)  You should be screened for sexually transmitted infections (STIs) including gonorrhea and chlamydia if: ? You are sexually active and are younger than 51 years of age. ? You are older than 51 years of age and your health care provider tells you that you are at risk for this type of infection. ? Your sexual activity has changed since you were last screened and you are at an increased risk for chlamydia or gonorrhea. Ask your health care provider if you are at risk.  If you do not have HIV, but are at risk, it may be recommended that you take a prescription medicine daily to prevent HIV infection. This is called  pre-exposure prophylaxis (PrEP). You are considered at risk if: ? You are sexually active and do not regularly use condoms or know the HIV status of your partner(s). ? You take drugs by injection. ? You are sexually active with a partner who has HIV.  Talk with your health care provider about whether you are at high risk of being infected with HIV. If you choose to begin PrEP, you should first be  tested for HIV. You should then be tested every 3 months for as long as you are taking PrEP. Pregnancy  If you are premenopausal and you may become pregnant, ask your health care provider about preconception counseling.  If you may become pregnant, take 400 to 800 micrograms (mcg) of folic acid every day.  If you want to prevent pregnancy, talk to your health care provider about birth control (contraception). Osteoporosis and menopause  Osteoporosis is a disease in which the bones lose minerals and strength with aging. This can result in serious bone fractures. Your risk for osteoporosis can be identified using a bone density scan.  If you are 16 years of age or older, or if you are at risk for osteoporosis and fractures, ask your health care provider if you should be screened.  Ask your health care provider whether you should take a calcium or vitamin D supplement to lower your risk for osteoporosis.  Menopause may have certain physical symptoms and risks.  Hormone replacement therapy may reduce some of these symptoms and risks. Talk to your health care provider about whether hormone replacement therapy is right for you. Follow these instructions at home:  Schedule regular health, dental, and eye exams.  Stay current with your immunizations.  Do not use any tobacco products including cigarettes, chewing tobacco, or electronic cigarettes.  If you are pregnant, do not drink alcohol.  If you are breastfeeding, limit how much and how often you drink alcohol.  Limit alcohol intake to no more  than 1 drink per day for nonpregnant women. One drink equals 12 ounces of beer, 5 ounces of wine, or 1 ounces of hard liquor.  Do not use street drugs.  Do not share needles.  Ask your health care provider for help if you need support or information about quitting drugs.  Tell your health care provider if you often feel depressed.  Tell your health care provider if you have ever been abused or do not feel safe at home. This information is not intended to replace advice given to you by your health care provider. Make sure you discuss any questions you have with your health care provider. Document Released: 02/10/2011 Document Revised: 01/03/2016 Document Reviewed: 05/01/2015 Elsevier Interactive Patient Education  2018 [employer].  IF you received an x-ray today, you will receive an invoice from [facility]. Please contact [facility] at [phone] with questions or concerns regarding your invoice.   IF you received labwork today, you will receive an invoice from [facility]. Please contact [facility] at [phone] with questions or concerns regarding your invoice.   Our billing staff will not be able to assist you with questions regarding bills from these companies.  You will be contacted with the lab results as soon as they are available. The fastest way to get your results is to activate your My Chart account. Instructions are located on the last page of this paperwork. If you have not heard from [place] regarding the results in 2 weeks, please contact this office.

## 2018-03-15 NOTE — Progress Notes (Signed)
[facility] [place], [place] [address] [phone]  Date:  03/15/2018   Name:  Marie Avila   DOB:  03-Apr-1966   MRN:  [id number]  PCP:  Patient, No Pcp Per    Chief Complaint: Establish Care   History of Present Illness:  This is a 52 y.o. female who is presenting for CPE. She works as a Marine scientist.   Complaints: none LMP: 03/07/2018 Contraception: ocp Last pap: 04/2017 - negative. Sees GYN yearly.  Sexual history: intercourse with one partner - declines STD check today.  Immunizations:  utd Dentist:  q 6 months Eye: glasses. Last check 1 year ago Diet/Exercise: exercises 4x/week.  Fam hx: family history includes Cancer in her father; Heart disease in her maternal grandfather; Hypertension in her maternal grandfather; Other in her maternal grandmother, mother, paternal grandfather, and paternal grandmother; Stroke in her maternal grandmother.  Tobacco/alcohol/substance use:  Never Smoker. She reports that she drinks about 1.2 oz of alcohol per week . She reports that she does not use drugs.   Mammogram: 08/2017. No mammographic evidence of malignancy. Screening MM in one year. Colonoscopy: 09/07/2017 - return in 5 years    Review of Systems:  Review of Systems  Constitutional: Negative for chills, diaphoresis, fatigue and fever.  HENT: Negative for congestion, postnasal drip, rhinorrhea, sinus pressure, sneezing and sore throat.   Respiratory: Negative for cough, chest tightness, shortness of breath and wheezing.   Cardiovascular: Negative for chest pain and palpitations.  Gastrointestinal: Negative for abdominal pain, diarrhea, nausea and vomiting.  Genitourinary: Negative for decreased urine volume, difficulty urinating, dysuria, enuresis, flank pain, frequency, hematuria and urgency.  Musculoskeletal: Negative for back pain.  Neurological: Negative for dizziness, weakness, light-headedness and headaches.    Patient Active Problem List   Diagnosis  Date Noted  . 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 Tonsillitis, chronic 07/14/2017  . Pain of left heel 05/20/2016  . Ischial bursitis of right side 05/24/2013  . Piriformis syndrome of right side 05/24/2013  . 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 Authorizing Provider  docusate sodium (COLACE) 100 MG capsule Take 100 mg by mouth at bedtime.   Yes [provider]  Norgestimate-Ethinyl Estradiol Triphasic (TRI-PREVIFEM) 0.18/0.215/0.25 MG-35 MCG tablet Take 1 tablet by mouth daily. 04/15/17  Yes Regina Eck, CNM  imiquimod (ALDARA) 5 % cream APPLY A SMALL AMOUNT TO WARTS AT NIGHT ON MON, WED, AND FRI AS DIRECTED 07/07/17   [provider]    Allergies  Allergen Reactions  . Bee Venom   . Penicillins     Past Surgical History:  Procedure Laterality Date  . WISDOM TOOTH EXTRACTION  age 89    Social History   Tobacco Use  . Smoking status: Never Smoker  . Smokeless tobacco: Never Used  Substance Use Topics  . Alcohol use: Yes    Alcohol/week: 1.2 oz    Types: 2 Standard drinks or equivalent per week    Comment: social  . Drug use: No    Family History  Problem Relation Age of Onset  . Hypertension Maternal Grandfather   . Heart disease Maternal Grandfather   . Cancer Father        leukemia  . Other Mother        Altzheimers  . Other Maternal Grandmother   . Stroke Maternal Grandmother   . Other Paternal Grandmother   . Other Paternal Grandfather   .  Sudden death Neg Hx   . Hyperlipidemia Neg Hx   . Heart attack Neg Hx   . Diabetes Neg Hx     Medication list has been reviewed and updated.  Physical Examination:  Physical Exam  Constitutional: She is oriented to person, place, and time. She appears well-developed and well-nourished. No distress.  HENT:  Head: Normocephalic and atraumatic.  Mouth/Throat: Oropharynx is clear and moist.  Eyes: Pupils are equal,  round, and reactive to light. Conjunctivae and EOM are normal.  Cardiovascular: Normal rate, regular rhythm and normal heart sounds.  No murmur heard. Pulmonary/Chest: Effort normal and breath sounds normal. She has no wheezes.  Abdominal: Soft. She exhibits no mass. There is no tenderness.  Musculoskeletal: Normal range of motion.  Neurological: She is alert and oriented to person, place, and time. She has normal reflexes.  Skin: Skin is warm and dry.  Psychiatric: She has a normal mood and affect. Her behavior is normal. Judgment and thought content normal.  Vitals reviewed.   BP 119/77   Pulse 62   Temp 98.3 F (36.8 C) (Oral)   Resp 18   Ht 5' 7"  (1.702 m)   Wt 127 lb (57.6 kg)   LMP 03/07/2018   SpO2 100%   BMI 19.89 kg/m  Results for orders placed or performed in visit on 03/15/18  POCT urinalysis dipstick  Result Value Ref Range   Color, UA yellow yellow   Clarity, UA clear clear   Glucose, UA negative negative mg/dL   Bilirubin, UA negative negative   Ketones, POC UA negative negative mg/dL   Spec Grav, UA 1.010 1.010 - 1.025   Blood, UA negative negative   pH, UA 7.0 5.0 - 8.0   Protein Ur, POC negative negative mg/dL   Urobilinogen, UA 0.2 0.2 or 1.0 E.U./dL   Nitrite, UA Negative Negative   Leukocytes, UA Negative Negative    Assessment and Plan: 1. Annual physical exam - Patient presents for annual exam.  She is feeling well today.  UTD MM and Colonoscopy. PAPs by GYN q 3 years. Murmur heard on physical exam, plan to refer to cards.  Routine labs are pending today plan to contact with results.  Anticipatory guidance discussed.  2. Screening for endocrine, metabolic and immunity disorder - CBC with Differential/Platelet - Iron, TIBC and Ferritin Panel - CMP14+EGFR - POCT urinalysis dipstick  3. Family history of iron deficiency anemia - Iron, TIBC and Ferritin Panel  4. Heart murmur - Pt never been told she has a murmer. Plan to refer to cards for  eval.  - Ambulatory referral to Cardiology   Mercer Pod, PA-C  [facility] 03/15/2018 8:56 AM

## 2018-03-15 NOTE — Progress Notes (Deleted)
   Marie Avila  MRN: [id number] DOB: February 22, 1966  PCP: Patient, No Pcp Per  Subjective:  Pt presents to clinic for ***  Review of Systems  Patient Active Problem List   Diagnosis Date Noted  . 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 Tonsillitis, chronic 07/14/2017  . Pain of left heel 05/20/2016  . Ischial bursitis of right side 05/24/2013  . Piriformis syndrome of right side 05/24/2013  . 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 docusate sodium (COLACE) 100 MG capsule Take 100 mg by mouth at bedtime.    . Norgestimate-Ethinyl Estradiol Triphasic (TRI-PREVIFEM) 0.18/0.215/0.25 MG-35 MCG tablet Take 1 tablet by mouth daily. 3 Package 4  . imiquimod (ALDARA) 5 % cream APPLY A SMALL AMOUNT TO WARTS AT NIGHT ON MON, WED, AND FRI AS DIRECTED     No current facility-administered medications on file prior to visit.     Allergies  Allergen Reactions  . Bee Venom   . Penicillins      Objective:  BP 119/77   Pulse 62   Temp 98.3 F (36.8 C) (Oral)   Resp 18   Ht 5\' 7"  (1.702 m)   Wt 127 lb (57.6 kg)   LMP 03/07/2018   SpO2 100%   BMI 19.89 kg/m   Physical Exam  Assessment and Plan :  There are no diagnoses linked to this encounter.   Mercer Pod, PA-C  [facility] 03/15/2018 8:51 AM  Please note: Portions of this report may have been transcribed using dragon voice recognition software. Every effort was made to ensure accuracy; however, inadvertent computerized transcription errors may be present.

## 2018-03-16 LAB — CMP14+EGFR
ALT: 17 IU/L (ref 0–32)
AST: 19 IU/L (ref 0–40)
Albumin/Globulin Ratio: 1.7 (ref 1.2–2.2)
Albumin: 4.3 g/dL (ref 3.5–5.5)
Alkaline Phosphatase: 42 IU/L (ref 39–117)
BUN/Creatinine Ratio: 14 (ref 9–23)
BUN: 12 mg/dL (ref 6–24)
Bilirubin Total: 0.2 mg/dL (ref 0.0–1.2)
CO2: 24 mmol/L (ref 20–29)
Calcium: 9.5 mg/dL (ref 8.7–10.2)
Chloride: 101 mmol/L (ref 96–106)
Creatinine, Ser: 0.83 mg/dL (ref 0.57–1.00)
GFR calc Af Amer: 95 mL/min/{1.73_m2} (ref >59)
GFR calc non Af Amer: 82 mL/min/{1.73_m2} (ref >59)
Globulin, Total: 2.6 g/dL (ref 1.5–4.5)
Glucose: 96 mg/dL (ref 65–99)
Potassium: 4.3 mmol/L (ref 3.5–5.2)
Sodium: 140 mmol/L (ref 134–144)
Total Protein: 6.9 g/dL (ref 6.0–8.5)

## 2018-03-16 LAB — IRON,TIBC AND FERRITIN PANEL
Ferritin: 14 ng/mL — ABNORMAL LOW (ref 15–150)
Iron Saturation: 17 % (ref 15–55)
Iron: 73 ug/dL (ref 27–159)
Total Iron Binding Capacity: 427 ug/dL (ref 250–450)
UIBC: 354 ug/dL (ref 131–425)

## 2018-03-16 LAB — CBC WITH DIFFERENTIAL/PLATELET
Basophils Absolute: 0.1 10*3/uL (ref 0.0–0.2)
Basos: 1 %
EOS (ABSOLUTE): 0.1 10*3/uL (ref 0.0–0.4)
Eos: 1 %
Hematocrit: 38.9 % (ref 34.0–46.6)
Hemoglobin: 13 g/dL (ref 11.1–15.9)
Immature Grans (Abs): 0 10*3/uL (ref 0.0–0.1)
Immature Granulocytes: 0 %
Lymphocytes Absolute: 2 10*3/uL (ref 0.7–3.1)
Lymphs: 37 %
MCH: 31.9 pg (ref 26.6–33.0)
MCHC: 33.4 g/dL (ref 31.5–35.7)
MCV: 96 fL (ref 79–97)
Monocytes Absolute: 0.4 10*3/uL (ref 0.1–0.9)
Monocytes: 7 %
Neutrophils Absolute: 2.9 10*3/uL (ref 1.4–7.0)
Neutrophils: 54 %
Platelets: 270 10*3/uL (ref 150–450)
RBC: 4.07 x10E6/uL (ref 3.77–5.28)
RDW: 13.4 % (ref 12.3–15.4)
WBC: 5.4 10*3/uL (ref 3.4–10.8)

## 2018-03-29 ENCOUNTER — Encounter: Payer: 59 | Admitting: Physician Assistant

## 2018-04-20 ENCOUNTER — Ambulatory Visit: Payer: 59 | Admitting: Physician Assistant

## 2018-04-20 ENCOUNTER — Ambulatory Visit: Payer: 59 | Admitting: Certified Nurse Midwife

## 2018-04-21 ENCOUNTER — Ambulatory Visit: Payer: 59 | Admitting: Certified Nurse Midwife

## 2018-06-03 ENCOUNTER — Encounter: Payer: Self-pay | Admitting: Cardiovascular Disease

## 2018-06-03 ENCOUNTER — Ambulatory Visit: Payer: 59 | Admitting: Cardiovascular Disease

## 2018-06-03 VITALS — BP 108/80 | HR 70 | Ht 67.0 in | Wt 124.0 lb

## 2018-06-03 DIAGNOSIS — R011 Cardiac murmur, unspecified: Secondary | ICD-10-CM

## 2018-06-03 DIAGNOSIS — R0609 Other forms of dyspnea: Secondary | ICD-10-CM | POA: Diagnosis not present

## 2018-06-03 NOTE — Progress Notes (Signed)
Cardiology Office Note:    Date:  06/03/2018   ID:  Marie Avila, DOB Feb 07, 1966, MRN [id number]  PCP:  Dorise Hiss, PA-C  Cardiologist:  Mertie Moores, MD  Electrophysiologist:  None   Referring MD: Desiree Lucy*   Chief Complaint  Patient presents with  . Heart Murmur     History of Present Illness:    Marie Avila is a 52 y.o. female with a hx of a heart murmur.   We were asked to see her by McVey, Gelene Mink, PA-C for further evaluation of her heart murmur .  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  Is from [place].  Is otherwise healthy    History reviewed. No pertinent past medical history.  Past Surgical History:  Procedure Laterality Date  . WISDOM TOOTH EXTRACTION  age 60    Current Medications: Current Meds  Medication Sig  . docusate sodium (COLACE) 100 MG capsule Take 100 mg by mouth at bedtime.  . Norgestimate-Ethinyl Estradiol Triphasic (TRI-PREVIFEM) 0.18/0.215/0.25 MG-35 MCG tablet Take 1 tablet by mouth daily.     Allergies:   Bee venom and Penicillins   Social History   Socioeconomic History  . Marital status: Single    Spouse name: Not on file  . Number of children: Not on file  . Years of education: 74  . Highest education level: Not on file  Occupational History  . Occupation: Programmer, multimedia: [employer]  . Financial resource strain: Not on file  . Food insecurity:    Worry: Not on file    Inability: Not on file  . Transportation needs:    Medical: Not on file    Non-medical: Not on file  Tobacco Use  . Smoking status: Never Smoker  . Smokeless tobacco: Never Used  Substance and Sexual Activity  . Alcohol use: Yes    Alcohol/week: 2.0 standard drinks    Types: 2 Standard  drinks or equivalent per week    Comment: social  . Drug use: No  . Sexual activity: Yes    Partners: Male    Birth control/protection: Pill    Comment: Tri-Previfem  Lifestyle  . Physical activity:    Days per week: Not on file    Minutes per session: Not on file  . Stress: Not on file  Relationships  . Social connections:    Talks on phone: Not on file    Gets together: Not on file    Attends religious service: Not on file    Active member of club or organization: Not on file    Attends meetings of clubs or organizations: Not on file    Relationship status: Not on file  Other Topics Concern  . Not on file  Social History Narrative  . Not on file     Family History: The patient's family history includes Cancer in her father; Heart disease in her maternal grandfather; Hypertension in her maternal grandfather; Other in her maternal grandmother, mother, paternal grandfather, and paternal grandmother; Stroke in her maternal grandmother. There is no history of Sudden death, Hyperlipidemia, Heart attack, or Diabetes.  ROS:   Please see the history of present illness.  All other systems reviewed and are negative.  EKGs/Labs/Other Studies Reviewed:    The following studies were reviewed today:   EKG:    Recent Labs: 03/15/2018: ALT 17; BUN 12; Creatinine, Ser 0.83; Hemoglobin 13.0; Platelets 270; Potassium 4.3; Sodium 140  Recent Lipid Panel    Component Value Date/Time   CHOL 217 (H) 04/15/2017 0945   TRIG 117 04/15/2017 0945   HDL 141 04/15/2017 0945   CHOLHDL 1.5 04/15/2017 0945   CHOLHDL 1.6 02/14/2016 0858   VLDL 20 02/14/2016 0858   LDLCALC 53 04/15/2017 0945   LDLDIRECT 61.9 11/25/2011 1304    Physical Exam:    VS:  BP 108/80   Pulse 70   Ht 5\' 7"  (1.702 m)   Wt 124 lb (56.2 kg)   SpO2 98%   BMI 19.42 kg/m     Wt Readings from Last 3 Encounters:  06/03/18 124 lb (56.2 kg)  03/15/18 127 lb (57.6 kg)  08/28/17 128 lb (58.1 kg)     GEN:  Thin,   Middle age female  HEENT: Normal NECK: No JVD; No carotid bruits LYMPHATICS: No lymphadenopathy CARDIAC:  RR, very soft systolic murmur  RESPIRATORY:  Clear to auscultation without rales, wheezing or rhonchi  ABDOMEN: Soft, non-tender, non-distended MUSCULOSKELETAL:  No edema; No deformity  SKIN: Warm and dry NEUROLOGIC:  Alert and oriented x 3 PSYCHIATRIC:  Normal affect   ASSESSMENT:     PLAN:    In order of problems listed above:  1. Heart murmur: Marie Avila presents for further evaluation of a heart murmur.  She has a very soft functional murmur.  I do not think that she has any significant valvular abnormalities.  I cannot rule out mild valvular issues.  We will get an echocardiogram for further evaluation.  2.  Shortness of breath on exertion.  Marie Avila is an avid runner.  She is run several marathons.  She was recently training for marathon but found that her times of this year or about 2 minutes per mile slower than they were compared to last year. She does not have any significant valvular abnormalities on exam.  Will be getting an echocardiogram for further evaluation of her of her valvular function and her LV function.  Follow up In 6 weeks for follow-up visit.     Medication Adjustments/Labs and Tests Ordered: Current medicines are reviewed at length with the patient today.  Concerns regarding medicines are outlined above.  Orders Placed This Encounter  Procedures  . EKG 12-Lead  . ECHOCARDIOGRAM COMPLETE   No orders of the defined types were placed in this encounter.   Patient Instructions  Medication Instructions:  Your physician recommends that you continue on your current medications as directed. Please refer to the Current Medication list given to you today.  If you need a refill on your cardiac medications before your next appointment, please call your pharmacy.   Lab work: None Ordered    Testing/Procedures: Your physician has requested that you have an  echocardiogram. Echocardiography is a painless test that uses sound waves to create images of your heart. It provides your doctor with information about the size and shape of your heart and how well your heart's chambers and valves are working. This procedure takes approximately one hour. There are no restrictions for this procedure.    Follow-Up: Your physician recommends that you schedule a follow-up appointment in: 6 weeks with Dr. Acie Fredrickson      Signed, Mertie Moores, MD  06/03/2018 2:28 PM  [facility]

## 2018-06-03 NOTE — Patient Instructions (Signed)
Medication Instructions:  Your physician recommends that you continue on your current medications as directed. Please refer to the Current Medication list given to you today.  If you need a refill on your cardiac medications before your next appointment, please call your pharmacy.   Lab work: None Ordered    Testing/Procedures: Your physician has requested that you have an echocardiogram. Echocardiography is a painless test that uses sound waves to create images of your heart. It provides your doctor with information about the size and shape of your heart and how well your heart's chambers and valves are working. This procedure takes approximately one hour. There are no restrictions for this procedure.    Follow-Up: Your physician recommends that you schedule a follow-up appointment in: 6 weeks with Dr. Acie Fredrickson

## 2018-06-08 ENCOUNTER — Ambulatory Visit (INDEPENDENT_AMBULATORY_CARE_PROVIDER_SITE_OTHER): Payer: 59 | Admitting: Certified Nurse Midwife

## 2018-06-08 ENCOUNTER — Encounter: Payer: Self-pay | Admitting: Certified Nurse Midwife

## 2018-06-08 ENCOUNTER — Other Ambulatory Visit: Payer: Self-pay

## 2018-06-08 ENCOUNTER — Ambulatory Visit (HOSPITAL_COMMUNITY): Payer: 59 | Attending: Cardiovascular Disease

## 2018-06-08 VITALS — BP 100/64 | HR 68 | Resp 16 | Ht 67.5 in | Wt 127.0 lb

## 2018-06-08 DIAGNOSIS — Z3041 Encounter for surveillance of contraceptive pills: Secondary | ICD-10-CM

## 2018-06-08 DIAGNOSIS — R011 Cardiac murmur, unspecified: Secondary | ICD-10-CM | POA: Insufficient documentation

## 2018-06-08 DIAGNOSIS — R0609 Other forms of dyspnea: Secondary | ICD-10-CM | POA: Diagnosis not present

## 2018-06-08 DIAGNOSIS — Z01419 Encounter for gynecological examination (general) (routine) without abnormal findings: Secondary | ICD-10-CM | POA: Diagnosis not present

## 2018-06-08 LAB — ECHOCARDIOGRAM COMPLETE
Height: 67.5 in
Weight: 2032 oz

## 2018-06-08 MED ORDER — NORETHINDRONE 0.35 MG PO TABS: 1.0000 | ORAL_TABLET | Freq: Every day | ORAL | 3 refills | Status: DC

## 2018-06-08 MED FILL — NORETHINDRONE 0.35 MG TAB: 0.35 | 84 days supply | Qty: 84 | Fill #0

## 2018-06-08 NOTE — Patient Instructions (Signed)

## 2018-06-08 NOTE — Progress Notes (Signed)
52 y.o. G0P0 Single  Caucasian Fe here for annual exam. Periods normal, no issues. OCP working well aware she will have to switch to POP due to age. Patient considered just going off, but prefers help with cycle control. Denies any warning signs with OCP use in past year. No partner change or STD concerns. Has started working at the hospital again, many changes! See Dr Rosanne Sack for aex exam and labs. Declines any today.No other health concerns today.  Patient's last menstrual period was 05/16/2018 (exact date).          Sexually active: Yes.    The current method of family planning is OCP (estrogen/progesterone).    Exercising: Yes.    run Smoker:  no  Review of Systems  Constitutional: Negative.   HENT: Negative.   Eyes: Negative.   Respiratory: Negative.   Cardiovascular: Negative.   Gastrointestinal: Negative.   Genitourinary: Negative.   Musculoskeletal: Negative.   Skin: Negative.   Neurological: Negative.   Endo/Heme/Allergies: Negative.   Psychiatric/Behavioral: Negative.     Health Maintenance: Pap:  04-15-17 neg HPV HR neg History of Abnormal Pap: no MMG:  09-01-17 category c density birads 1:neg Self Breast exams: no Colonoscopy:  09-07-2017 f/u 70yrs due to prep BMD:   none TDaP:  2013 Shingles: no Pneumonia: no Hep C and HIV: both neg 2017 Labs:    reports that she has never smoked. She has never used smokeless tobacco. She reports that she drinks about 2.0 standard drinks of alcohol per week. She reports that she does not use drugs.  Past Medical History:  Diagnosis Date  . Heart murmur    low grade    Past Surgical History:  Procedure Laterality Date  . WISDOM TOOTH EXTRACTION  age 69    Current Outpatient Medications  Medication Sig Dispense Refill  . docusate sodium (COLACE) 100 MG capsule Take 100 mg by mouth at bedtime.    . Norgestimate-Ethinyl Estradiol Triphasic (TRI-PREVIFEM) 0.18/0.215/0.25 MG-35 MCG tablet Take 1 tablet by mouth daily. 3 Package  4   No current facility-administered medications for this visit.     Family History  Problem Relation Age of Onset  . Hypertension Maternal Grandfather   . Heart disease Maternal Grandfather   . Cancer Father        leukemia  . Other Mother        Altzheimers  . Other Maternal Grandmother   . Stroke Maternal Grandmother   . Other Paternal Grandmother   . Other Paternal Grandfather     ROS:  Pertinent items are noted in HPI.  Otherwise, a comprehensive ROS was negative.  Exam:   BP 100/64   Pulse 68   Resp 16   Ht 5' 7.5" (1.715 m)   Wt 127 lb (57.6 kg)   LMP 05/16/2018 (Exact Date)   BMI 19.60 kg/m  Height: 5' 7.5" (171.5 cm) Ht Readings from Last 3 Encounters:  06/08/18 5' 7.5" (1.715 m)  06/03/18 5\' 7"  (1.702 m)  03/15/18 5\' 7"  (1.702 m)    General appearance: alert, cooperative and appears stated age Head: Normocephalic, without obvious abnormality, atraumatic Neck: no adenopathy, supple, symmetrical, trachea midline and thyroid normal to inspection and palpation Lungs: clear to auscultation bilaterally Breasts: normal appearance, no masses or tenderness, No nipple retraction or dimpling, No nipple discharge or bleeding, No axillary or supraclavicular adenopathy Heart: regular rate and rhythm Abdomen: soft, non-tender; no masses,  no organomegaly Extremities: extremities normal, atraumatic, no cyanosis or edema Skin:  Skin color, texture, turgor normal. No rashes or lesions Lymph nodes: Cervical, supraclavicular, and axillary nodes normal. No abnormal inguinal nodes palpated Neurologic: Grossly normal   Pelvic: External genitalia:  no lesions              Urethra:  normal appearing urethra with no masses, tenderness or lesions              Bartholin's and Skene's: normal                 Vagina: normal appearing vagina with normal color and discharge, no lesions              Cervix: no cervical motion tenderness, no lesions and nulliparous appearance               Pap taken: No. Bimanual Exam:  Uterus:  normal size, contour, position, consistency, mobility, non-tender and anteverted              Adnexa: normal adnexa and no mass, fullness, tenderness               Rectovaginal: Confirms               Anus:  normal sphincter tone, no lesions  Chaperone present: yes  A:  Well Woman with normal exam  Contraception OCP working well for cycle control and contraception  Perimenopausal no cycle changes    P:   Reviewed health and wellness pertinent to exam  Discussed recommendation to transistion off OCP at this age due to increase risk of DVT. Patient desires to continue with POP use for cycle control. Discussed warning signs with use and instructions to start with next cycle. Bleeding profile and expectations discussed.  Rx Micronor see order with instructions  Discussed Perimenopausal/menopausal change expectations. Average age 81-55. Questions addressed.  Continue PCP aex/labs yearly.  Pap smear: no   counseled on breast self exam, mammography screening, feminine hygiene, adequate intake of calcium and vitamin D, diet and exercise  return annually or prn  An After Visit Summary was printed and given to the patient.

## 2018-06-10 ENCOUNTER — Other Ambulatory Visit: Payer: Self-pay | Admitting: Certified Nurse Midwife

## 2018-06-10 DIAGNOSIS — Z3041 Encounter for surveillance of contraceptive pills: Secondary | ICD-10-CM

## 2018-07-20 DIAGNOSIS — M5432 Sciatica, left side: Secondary | ICD-10-CM | POA: Diagnosis not present

## 2018-07-20 DIAGNOSIS — M9903 Segmental and somatic dysfunction of lumbar region: Secondary | ICD-10-CM | POA: Diagnosis not present

## 2018-07-20 DIAGNOSIS — M5431 Sciatica, right side: Secondary | ICD-10-CM | POA: Diagnosis not present

## 2018-07-21 ENCOUNTER — Ambulatory Visit: Payer: 59 | Admitting: Cardiovascular Disease

## 2018-07-21 DIAGNOSIS — M5432 Sciatica, left side: Secondary | ICD-10-CM | POA: Diagnosis not present

## 2018-07-21 DIAGNOSIS — M9903 Segmental and somatic dysfunction of lumbar region: Secondary | ICD-10-CM | POA: Diagnosis not present

## 2018-07-21 DIAGNOSIS — M5431 Sciatica, right side: Secondary | ICD-10-CM | POA: Diagnosis not present

## 2018-07-22 DIAGNOSIS — M5431 Sciatica, right side: Secondary | ICD-10-CM | POA: Diagnosis not present

## 2018-07-22 DIAGNOSIS — M9903 Segmental and somatic dysfunction of lumbar region: Secondary | ICD-10-CM | POA: Diagnosis not present

## 2018-07-22 DIAGNOSIS — M5432 Sciatica, left side: Secondary | ICD-10-CM | POA: Diagnosis not present

## 2018-07-27 DIAGNOSIS — M5431 Sciatica, right side: Secondary | ICD-10-CM | POA: Diagnosis not present

## 2018-07-27 DIAGNOSIS — M9903 Segmental and somatic dysfunction of lumbar region: Secondary | ICD-10-CM | POA: Diagnosis not present

## 2018-07-27 DIAGNOSIS — M5432 Sciatica, left side: Secondary | ICD-10-CM | POA: Diagnosis not present

## 2018-07-29 DIAGNOSIS — M9903 Segmental and somatic dysfunction of lumbar region: Secondary | ICD-10-CM | POA: Diagnosis not present

## 2018-07-29 DIAGNOSIS — M5432 Sciatica, left side: Secondary | ICD-10-CM | POA: Diagnosis not present

## 2018-07-29 DIAGNOSIS — M5431 Sciatica, right side: Secondary | ICD-10-CM | POA: Diagnosis not present

## 2018-08-03 DIAGNOSIS — M9903 Segmental and somatic dysfunction of lumbar region: Secondary | ICD-10-CM | POA: Diagnosis not present

## 2018-08-03 DIAGNOSIS — M5432 Sciatica, left side: Secondary | ICD-10-CM | POA: Diagnosis not present

## 2018-08-03 DIAGNOSIS — M5431 Sciatica, right side: Secondary | ICD-10-CM | POA: Diagnosis not present

## 2018-08-09 DIAGNOSIS — M5431 Sciatica, right side: Secondary | ICD-10-CM | POA: Diagnosis not present

## 2018-08-09 DIAGNOSIS — M9903 Segmental and somatic dysfunction of lumbar region: Secondary | ICD-10-CM | POA: Diagnosis not present

## 2018-08-09 DIAGNOSIS — M5432 Sciatica, left side: Secondary | ICD-10-CM | POA: Diagnosis not present

## 2018-08-16 DIAGNOSIS — M5432 Sciatica, left side: Secondary | ICD-10-CM | POA: Diagnosis not present

## 2018-08-16 DIAGNOSIS — M5431 Sciatica, right side: Secondary | ICD-10-CM | POA: Diagnosis not present

## 2018-08-16 DIAGNOSIS — M9903 Segmental and somatic dysfunction of lumbar region: Secondary | ICD-10-CM | POA: Diagnosis not present

## 2018-08-18 DIAGNOSIS — M9903 Segmental and somatic dysfunction of lumbar region: Secondary | ICD-10-CM | POA: Diagnosis not present

## 2018-08-18 DIAGNOSIS — M5432 Sciatica, left side: Secondary | ICD-10-CM | POA: Diagnosis not present

## 2018-08-18 DIAGNOSIS — M5431 Sciatica, right side: Secondary | ICD-10-CM | POA: Diagnosis not present

## 2018-08-24 DIAGNOSIS — M5432 Sciatica, left side: Secondary | ICD-10-CM | POA: Diagnosis not present

## 2018-08-24 DIAGNOSIS — M9903 Segmental and somatic dysfunction of lumbar region: Secondary | ICD-10-CM | POA: Diagnosis not present

## 2018-08-24 DIAGNOSIS — M5431 Sciatica, right side: Secondary | ICD-10-CM | POA: Diagnosis not present

## 2018-08-26 DIAGNOSIS — M5431 Sciatica, right side: Secondary | ICD-10-CM | POA: Diagnosis not present

## 2018-08-26 DIAGNOSIS — M9903 Segmental and somatic dysfunction of lumbar region: Secondary | ICD-10-CM | POA: Diagnosis not present

## 2018-08-26 DIAGNOSIS — M5432 Sciatica, left side: Secondary | ICD-10-CM | POA: Diagnosis not present

## 2018-08-26 MED FILL — NORLYDA 0.35 MG TABS: 0.35 | 84 days supply | Qty: 84 | Fill #1

## 2018-08-31 DIAGNOSIS — M5432 Sciatica, left side: Secondary | ICD-10-CM | POA: Diagnosis not present

## 2018-08-31 DIAGNOSIS — M5431 Sciatica, right side: Secondary | ICD-10-CM | POA: Diagnosis not present

## 2018-08-31 DIAGNOSIS — M9903 Segmental and somatic dysfunction of lumbar region: Secondary | ICD-10-CM | POA: Diagnosis not present

## 2018-09-22 DIAGNOSIS — M9903 Segmental and somatic dysfunction of lumbar region: Secondary | ICD-10-CM | POA: Diagnosis not present

## 2018-09-22 DIAGNOSIS — M5431 Sciatica, right side: Secondary | ICD-10-CM | POA: Diagnosis not present

## 2018-09-22 DIAGNOSIS — M5432 Sciatica, left side: Secondary | ICD-10-CM | POA: Diagnosis not present

## 2018-10-14 DIAGNOSIS — M5431 Sciatica, right side: Secondary | ICD-10-CM | POA: Diagnosis not present

## 2018-10-14 DIAGNOSIS — M9903 Segmental and somatic dysfunction of lumbar region: Secondary | ICD-10-CM | POA: Diagnosis not present

## 2018-10-14 DIAGNOSIS — M5432 Sciatica, left side: Secondary | ICD-10-CM | POA: Diagnosis not present

## 2018-10-26 ENCOUNTER — Other Ambulatory Visit: Payer: Self-pay | Admitting: Obstetrics and Gynecology

## 2018-10-26 ENCOUNTER — Other Ambulatory Visit: Payer: Self-pay | Admitting: Certified Nurse Midwife

## 2018-10-26 ENCOUNTER — Telehealth: Payer: Self-pay | Admitting: Certified Nurse Midwife

## 2018-10-26 DIAGNOSIS — Z1231 Encounter for screening mammogram for malignant neoplasm of breast: Secondary | ICD-10-CM

## 2018-10-26 NOTE — Telephone Encounter (Signed)
Patient is asking for a new prescription for an estrogen patch. [facility].

## 2018-10-26 NOTE — Telephone Encounter (Signed)
Spoke with patient. Requesting RX for estrogen patches for hot flashes, night sweats and insomnia. Changed from OCP to POP 05/2018, symptoms started after the change. Per review of Epic, last MMG 09/01/17 at [facility], advised will need updated MMG. Recommended OV for further discussion of HRT.   OV scheduled for 3/18 at 2:45pm with Melvia Heaps, CNM.  Routing to provider for final review. Patient is agreeable to disposition. Will close encounter.

## 2018-10-27 ENCOUNTER — Other Ambulatory Visit: Payer: Self-pay

## 2018-10-27 ENCOUNTER — Ambulatory Visit: Payer: 59 | Admitting: Certified Nurse Midwife

## 2018-10-27 ENCOUNTER — Encounter: Payer: Self-pay | Admitting: Certified Nurse Midwife

## 2018-10-27 VITALS — BP 110/70 | HR 64 | Resp 16 | Wt 124.0 lb

## 2018-10-27 DIAGNOSIS — E559 Vitamin D deficiency, unspecified: Secondary | ICD-10-CM

## 2018-10-27 DIAGNOSIS — N951 Menopausal and female climacteric states: Secondary | ICD-10-CM

## 2018-10-27 NOTE — Progress Notes (Signed)
Review of Systems  Constitutional: Negative.        Hot flashes and night sweats  HENT: Negative.   Eyes: Negative.   Respiratory: Negative.   Cardiovascular: Negative.   Gastrointestinal: Negative.   Genitourinary: Negative.   Musculoskeletal: Negative.   Skin: Negative.   Neurological: Negative.   Endo/Heme/Allergies: Negative.   Psychiatric/Behavioral: Negative.     52 y.o. Married Caucasian G0P0here for evaluation of Camilla  initiated on 06/08/18 for contraception, consult only. Menses duration none since 05/16/18       . Patient taking medication as prescribed. Denies missed pills, headaches, nausea, DVT warning signs or symptoms,  breakthrough bleeding, or other changes.  Keeping menses calendar.Patient experiencing hot flashes and night sweats, that are causing increasing insomnia. Feels she may be in menopause. No vaginal dryness or other concerns, but needs help with this. Also was here today for Vitamin D deficiency follow up. Has been taking as directed, no changes. Declines exam. No other health issues today.   O: Healthy female, WD WN Affect: normal orientation X 3     A: History of Vitamin D deficiency, here for follow up lab Contraception POP, having hot flashes and night sweats. Perimenopausal ?  P: Discussed will wait for lab results to see if improved, if not will continue supplement. Discussed importance for bone support. Discussed perimenopause/menopause etiology and expectations with periods. Printed information on menopause given. Also discussed with POP use periods may not occur and this is normal. Discussed lab evaluation for menopause due to symptoms and possible HRT use if indicated to help with vasomotor symptoms. Questions addressed at length. Also discussed she may or may not have another period with being on POP. Labs: TSH, FSH,Vitamin D, AMH  RV prn   22 minutes spent with patient with  in face to face counseling.

## 2018-10-28 ENCOUNTER — Encounter: Payer: Self-pay | Admitting: Certified Nurse Midwife

## 2018-10-30 LAB — TSH: TSH: 1.23 u[IU]/mL (ref 0.450–4.500)

## 2018-10-30 LAB — VITAMIN D 25 HYDROXY (VIT D DEFICIENCY, FRACTURES): Vit D, 25-Hydroxy: 33.3 ng/mL (ref 30.0–100.0)

## 2018-10-30 LAB — FOLLICLE STIMULATING HORMONE: FSH: 66.3 m[IU]/mL

## 2018-10-30 LAB — ANTI MULLERIAN HORMONE: ANTI-MULLERIAN HORMONE (AMH): <0.015 ng/mL

## 2018-11-05 ENCOUNTER — Ambulatory Visit (INDEPENDENT_AMBULATORY_CARE_PROVIDER_SITE_OTHER): Payer: 59 | Admitting: Obstetrics and Gynecology

## 2018-11-05 ENCOUNTER — Other Ambulatory Visit: Payer: Self-pay

## 2018-11-05 ENCOUNTER — Encounter: Payer: Self-pay | Admitting: Obstetrics and Gynecology

## 2018-11-05 DIAGNOSIS — N951 Menopausal and female climacteric states: Secondary | ICD-10-CM

## 2018-11-05 MED ORDER — PROGESTERONE MICRONIZED 100 MG PO CAPS: 100.0000 mg | ORAL_CAPSULE | Freq: Every day | ORAL | 0 refills | Status: DC

## 2018-11-05 MED ORDER — ESTRADIOL 0.05 MG/24HR TD PTTW: 1.0000 | MEDICATED_PATCH | TRANSDERMAL | 0 refills | Status: DC

## 2018-11-05 MED FILL — DOTTI 0.05 MG/24HR PTTW: 0.05 | 84 days supply | Qty: 24 | Fill #0

## 2018-11-05 MED FILL — PROGESTERONE 100 MG CAPSULE: 100 | 90 days supply | Qty: 90 | Fill #0

## 2018-11-05 NOTE — Progress Notes (Signed)
GYNECOLOGY  VISIT   HPI: 53 y.o.   Single  Caucasian  female   G0P0 with Patient's last menstrual period was 05/16/2018 (exact date).   here for telephone consult regarding starting hormone replacement therapy. I was in the office.   Consent for phone consultation obtained.  Started 12:14.  Ended at 12:29.   Started taking Micronor in March 2020 but stopped due to symptoms and her labs returning showing menopause. Not sleeping.  Having a lot of hot flashes.   [place] 66.3 on 3/18.20. AMH  < 0.015.   Is a vegetarian.   GYNECOLOGIC HISTORY: Patient's last menstrual period was 05/16/2018 (exact date). Contraception:  None, just stopped micronor due to lab results Menopausal hormone therapy:  none Last mammogram:  09-01-17 category c density birads 1:neg, scheduled 12-01-2018 Last pap smear:   04-15-2017 neg HPV HR neg        OB History    Gravida  0   Para      Term      Preterm      AB      Living        SAB      TAB      Ectopic      Multiple      Live Births                 Patient Active Problem List   Diagnosis Date Noted  . 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 Tonsillitis, chronic 07/14/2017  . Pain of left heel 05/20/2016  . Ischial bursitis of right side 05/24/2013  . Piriformis syndrome of right side 05/24/2013  . 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 Heart murmur    low grade    Past Surgical History:  Procedure Laterality Date  . WISDOM TOOTH EXTRACTION  age 50    Current Outpatient Medications  Medication Sig Dispense Refill  . B Complex Vitamins (B COMPLEX PO) Take by mouth.    . docusate sodium (COLACE) 100 MG capsule Take 100 mg by mouth at bedtime.     No current facility-administered medications for this visit.      ALLERGIES: Bee venom and Penicillins  Family History  Problem Relation Age of Onset  . Hypertension Maternal Grandfather   . Heart disease Maternal Grandfather   . Cancer  Father        leukemia  . Other Mother        Altzheimers  . Other Maternal Grandmother   . Stroke Maternal Grandmother   . Other Paternal Grandmother   . Other Paternal Grandfather     Social History   Socioeconomic History  . Marital status: Single    Spouse name: Not on file  . Number of children: Not on file  . Years of education: 57  . Highest education level: Not on file  Occupational History  . Occupation: Programmer, multimedia: [employer]  . Financial resource strain: Not on file  . Food insecurity:    Worry: Not on file    Inability: Not on file  . Transportation needs:    Medical: Not on file    Non-medical: Not on file  Tobacco Use  . Smoking status: Never Smoker  . Smokeless tobacco: Never Used  Substance and Sexual Activity  . Alcohol use: Yes    Alcohol/week: 2.0 standard drinks    Types: 2 Standard drinks or equivalent per  week    Comment: social  . Drug use: No  . Sexual activity: Yes    Partners: Male    Birth control/protection: None  Lifestyle  . Physical activity:    Days per week: Not on file    Minutes per session: Not on file  . Stress: Not on file  Relationships  . Social connections:    Talks on phone: Not on file    Gets together: Not on file    Attends religious service: Not on file    Active member of club or organization: Not on file    Attends meetings of clubs or organizations: Not on file    Relationship status: Not on file  . Intimate partner violence:    Fear of current or ex partner: Not on file    Emotionally abused: Not on file    Physically abused: Not on file    Forced sexual activity: Not on file  Other Topics Concern  . Not on file  Social History Narrative  . Not on file    Review of Systems  Constitutional:       Hot flashes, unable to sleep at night, night sweats  HENT: Negative.   Eyes: Negative.   Respiratory: Negative.   Cardiovascular: Negative.   Gastrointestinal: Negative.   Endocrine:  Negative.   Genitourinary: Negative.   Musculoskeletal: Negative.   Skin: Negative.   Allergic/Immunologic: Negative.   Neurological: Negative.   Psychiatric/Behavioral: Negative.     PHYSICAL EXAMINATION:    LMP 05/16/2018 (Exact Date)       NA.  ASSESSMENT  Menopausal symptoms.   PLAN  We discussed menopausal symptoms and management with herbal options, HRT, SSRIs/SNRIs, and Gabapentin.  We reviewed risks and benefits of each.  She would like to try HRT. Discused WHI and use of HRT which can increase risk of PE, DVT, MI, stroke and breast cancer.  She will start transdermal estrogen, Vivelle Dot 0.5 mg twice weekly and Prometrium 100 mg po q hs.  Instructed in use.  She knows to expect some spotting but was instructed to call the office just so we understand if this is occurring.  She will update her mammogram.   Currently unable due to Covid 19 restrictions.  Questions invited and answered. Fu in 3 months, sooner as needed.   An After Visit Summary was printed and given to the patient.  __15____ minutes telephone consultation.

## 2018-12-01 ENCOUNTER — Ambulatory Visit: Payer: 59

## 2019-01-14 ENCOUNTER — Encounter: Payer: Self-pay | Admitting: Family Medicine

## 2019-01-14 ENCOUNTER — Other Ambulatory Visit: Payer: Self-pay

## 2019-01-14 ENCOUNTER — Ambulatory Visit (INDEPENDENT_AMBULATORY_CARE_PROVIDER_SITE_OTHER): Payer: 59 | Admitting: Family Medicine

## 2019-01-14 VITALS — BP 102/80 | HR 61 | Temp 98.0°F | Ht 67.5 in | Wt 125.0 lb

## 2019-01-14 DIAGNOSIS — Z13228 Encounter for screening for other metabolic disorders: Secondary | ICD-10-CM

## 2019-01-14 DIAGNOSIS — Z130 Encounter for screening for diseases of the blood and blood-forming organs and certain disorders involving the immune mechanism: Secondary | ICD-10-CM | POA: Diagnosis not present

## 2019-01-14 DIAGNOSIS — Z13220 Encounter for screening for lipoid disorders: Secondary | ICD-10-CM | POA: Diagnosis not present

## 2019-01-14 DIAGNOSIS — Z1329 Encounter for screening for other suspected endocrine disorder: Secondary | ICD-10-CM

## 2019-01-14 DIAGNOSIS — Z0000 Encounter for general adult medical examination without abnormal findings: Secondary | ICD-10-CM | POA: Diagnosis not present

## 2019-01-14 DIAGNOSIS — Z23 Encounter for immunization: Secondary | ICD-10-CM | POA: Diagnosis not present

## 2019-01-14 MED ORDER — ZOSTER VAC RECOMB ADJUVANTED 50 MCG/0.5ML IM SUSR: 0.5000 mL | Freq: Once | INTRAMUSCULAR | 1 refills | Status: AC

## 2019-01-14 NOTE — Patient Instructions (Signed)
Preventive Care 40-64 Years, Female Preventive care refers to lifestyle choices and visits with your health care provider that can promote health and wellness. What does preventive care include?   A yearly physical exam. This is also called an annual well check.  Dental exams once or twice a year.  Routine eye exams. Ask your health care provider how often you should have your eyes checked.  Personal lifestyle choices, including: ? Daily care of your teeth and gums. ? Regular physical activity. ? Eating a healthy diet. ? Avoiding tobacco and drug use. ? Limiting alcohol use. ? Practicing safe sex. ? Taking low-dose aspirin daily starting at age 50. ? Taking vitamin and mineral supplements as recommended by your health care provider. What happens during an annual well check? The services and screenings done by your health care provider during your annual well check will depend on your age, overall health, lifestyle risk factors, and family history of disease. Counseling Your health care provider may ask you questions about your:  Alcohol use.  Tobacco use.  Drug use.  Emotional well-being.  Home and relationship well-being.  Sexual activity.  Eating habits.  Work and work environment.  Method of birth control.  Menstrual cycle.  Pregnancy history. Screening You may have the following tests or measurements:  Height, weight, and BMI.  Blood pressure.  Lipid and cholesterol levels. These may be checked every 5 years, or more frequently if you are over 50 years old.  Skin check.  Lung cancer screening. You may have this screening every year starting at age 55 if you have a 30-pack-year history of smoking and currently smoke or have quit within the past 15 years.  Colorectal cancer screening. All adults should have this screening starting at age 50 and continuing until age 75. Your health care provider may recommend screening at age 45. You will have tests every  1-10 years, depending on your results and the type of screening test. People at increased risk should start screening at an earlier age. Screening tests may include: ? Guaiac-based fecal occult blood testing. ? Fecal immunochemical test (FIT). ? Stool DNA test. ? Virtual colonoscopy. ? Sigmoidoscopy. During this test, a flexible tube with a tiny camera (sigmoidoscope) is used to examine your rectum and lower colon. The sigmoidoscope is inserted through your anus into your rectum and lower colon. ? Colonoscopy. During this test, a long, thin, flexible tube with a tiny camera (colonoscope) is used to examine your entire colon and rectum.  Hepatitis C blood test.  Hepatitis B blood test.  Sexually transmitted disease (STD) testing.  Diabetes screening. This is done by checking your blood sugar (glucose) after you have not eaten for a while (fasting). You may have this done every 1-3 years.  Mammogram. This may be done every 1-2 years. Talk to your health care provider about when you should start having regular mammograms. This may depend on whether you have a family history of breast cancer.  BRCA-related cancer screening. This may be done if you have a family history of breast, ovarian, tubal, or peritoneal cancers.  Pelvic exam and Pap test. This may be done every 3 years starting at age 21. Starting at age 30, this may be done every 5 years if you have a Pap test in combination with an HPV test.  Bone density scan. This is done to screen for osteoporosis. You may have this scan if you are at high risk for osteoporosis. Discuss your test results, treatment options,   and if necessary, the need for more tests with your health care provider. Vaccines Your health care provider may recommend certain vaccines, such as:  Influenza vaccine. This is recommended every year.  Tetanus, diphtheria, and acellular pertussis (Tdap, Td) vaccine. You may need a Td booster every 10 years.  Varicella  vaccine. You may need this if you have not been vaccinated.  Zoster vaccine. You may need this after age 38.  Measles, mumps, and rubella (MMR) vaccine. You may need at least one dose of MMR if you were born in 1957 or later. You may also need a second dose.  Pneumococcal 13-valent conjugate (PCV13) vaccine. You may need this if you have certain conditions and were not previously vaccinated.  Pneumococcal polysaccharide (PPSV23) vaccine. You may need one or two doses if you smoke cigarettes or if you have certain conditions.  Meningococcal vaccine. You may need this if you have certain conditions.  Hepatitis A vaccine. You may need this if you have certain conditions or if you travel or work in places where you may be exposed to hepatitis A.  Hepatitis B vaccine. You may need this if you have certain conditions or if you travel or work in places where you may be exposed to hepatitis B.  Haemophilus influenzae type b (Hib) vaccine. You may need this if you have certain conditions. Talk to your health care provider about which screenings and vaccines you need and how often you need them. This information is not intended to replace advice given to you by your health care provider. Make sure you discuss any questions you have with your health care provider. Document Released: 08/24/2015 Document Revised: 09/17/2017 Document Reviewed: 05/29/2015 Elsevier Interactive Patient Education  2019 [employer].

## 2019-01-14 NOTE — Progress Notes (Signed)
6/5/202010:42 AM  Marie Avila 25-Jul-1966, 53 y.o., female [id number]  Chief Complaint  Patient presents with  . Annual Exam    HPI:   Patient is a 53 y.o. female with past medical history significant for HRT who presents today for CPE  Last CPE Aug 2019 Cervical Cancer Screening: sept 2019, neg pap and HPV Breast Cancer Screening:  Scheduled for June 11th Colorectal Cancer Screening: Jan 2019, fu 5 years Bone Density Testing: none HIV Screening: neg 2017 STI Screening: declines Seasonal Influenza Vaccination: due this season Td/Tdap Vaccination: 2013 Pneumococcal Vaccination: at age 30 Zoster Vaccination: at pharmacy of choice Frequency of Dental evaluation: Q6 months Frequency of Eye evaluation: yearly, does not uses glasses/contact Exercises: runs 3-4 x week, weight 2 x week Diet: vegetarian, takes vitamin B  Does not smoke Drinks 2 glasses of wine a week Denies illicit drug use  Fall Risk  01/14/2019 03/15/2018 05/12/2016  Falls in the past year? 0 No No  Number falls in past yr: 0 - -  Injury with Fall? 0 - -     Depression screen [facility] 2/9 01/14/2019 03/15/2018 05/12/2016  Decreased Interest 0 0 0  Down, Depressed, Hopeless 0 0 0  PHQ - 2 Score 0 0 0    Allergies  Allergen Reactions  . Bee Venom   . Penicillins     Prior to Admission medications   Medication Sig Start Date End Date Taking? Authorizing Provider  B Complex Vitamins (B COMPLEX PO) Take by mouth.   Yes [provider]  docusate sodium (COLACE) 100 MG capsule Take 100 mg by mouth at bedtime.   Yes [provider]  estradiol (VIVELLE-DOT) 0.05 MG/24HR patch Place 1 patch (0.05 mg total) onto the skin 2 (two) times a week. 11/08/18  Yes Amundson Raliegh Ip, MD  progesterone (PROMETRIUM) 100 MG capsule Take 1 capsule (100 mg total) by mouth daily. 11/05/18  Yes Nunzio Cobbs, MD    Past Medical History:  Diagnosis Date  . Heart murmur    low grade    Past  Surgical History:  Procedure Laterality Date  . WISDOM TOOTH EXTRACTION  age 84    Social History   Tobacco Use  . Smoking status: Never Smoker  . Smokeless tobacco: Never Used  Substance Use Topics  . Alcohol use: Yes    Alcohol/week: 2.0 standard drinks    Types: 2 Standard drinks or equivalent per week    Comment: social    Family History  Problem Relation Age of Onset  . Hypertension Maternal Grandfather   . Heart disease Maternal Grandfather   . Cancer Father        leukemia  . Other Mother        Altzheimers  . Other Maternal Grandmother   . Stroke Maternal Grandmother   . Other Paternal Grandmother   . Other Paternal Grandfather     Review of Systems  Constitutional: Negative for chills and fever.  Respiratory: Negative for cough and shortness of breath.   Cardiovascular: Negative for chest pain, palpitations and leg swelling.  Gastrointestinal: Negative for abdominal pain, nausea and vomiting.     OBJECTIVE:  Today's Vitals   01/14/19 0928  BP: 102/80  Pulse: 61  Temp: 98 F (36.7 C)  TempSrc: Oral  SpO2: 100%  Weight: 125 lb (56.7 kg)  Height: 5' 7.5" (1.715 m)   Body mass index is 19.29 kg/m.  Wt Readings from Last 3 Encounters:  01/14/19 125 lb (56.7 kg)  10/27/18 124 lb (56.2 kg)  06/08/18 127 lb (57.6 kg)    Physical Exam Vitals signs and nursing note reviewed.  Constitutional:      Appearance: She is well-developed.  HENT:     Head: Normocephalic and atraumatic.     Right Ear: Hearing, tympanic membrane, ear canal and external ear normal.     Left Ear: Hearing, tympanic membrane, ear canal and external ear normal.     Mouth/Throat:     Mouth: Mucous membranes are moist.     Pharynx: No oropharyngeal exudate or posterior oropharyngeal erythema.  Eyes:     Extraocular Movements: Extraocular movements intact.     Conjunctiva/sclera: Conjunctivae normal.     Pupils: Pupils are equal, round, and reactive to light.  Neck:      Musculoskeletal: Neck supple.     Thyroid: No thyromegaly.  Cardiovascular:     Rate and Rhythm: Normal rate and regular rhythm.     Heart sounds: Normal heart sounds. No murmur. No friction rub. No gallop.   Pulmonary:     Effort: Pulmonary effort is normal.     Breath sounds: Normal breath sounds. No wheezing, rhonchi or rales.  Abdominal:     General: Bowel sounds are normal. There is no distension.     Palpations: Abdomen is soft. There is no hepatomegaly, splenomegaly or mass.     Tenderness: There is no abdominal tenderness.  Musculoskeletal: Normal range of motion.     Right lower leg: No edema.     Left lower leg: No edema.  Lymphadenopathy:     Cervical: No cervical adenopathy.  Skin:    General: Skin is warm and dry.  Neurological:     Mental Status: She is alert and oriented to person, place, and time.     Cranial Nerves: No cranial nerve deficit.     Gait: Gait normal.     Deep Tendon Reflexes: Reflexes are normal and symmetric.  Psychiatric:        Mood and Affect: Mood normal.        Behavior: Behavior normal.       ASSESSMENT and PLAN  1. Annual physical exam Routine HCM labs ordered. HCM reviewed/discussed. Anticipatory guidance regarding healthy weight, lifestyle and choices given.   2. Screening for metabolic disorder - MCN47+SJGG  3. Screening for thyroid disorder - TSH  4. Screening for lipid disorders - Lipid panel  5. Screening for deficiency anemia Discussed cont vit b supplementation as vegetarian - CBC  6. Need for vaccination shingrix rx given  Other orders - Zoster Vaccine Adjuvanted [facility]) injection; Inject 0.5 mLs into the muscle once for 1 dose. Give second dose in 2 months from 1st dose  Return in about 1 year (around 01/14/2020).    Rutherford Guys, MD [facility] [place], [place] [address] Ph.  [phone] Fax [phone]

## 2019-01-15 LAB — CMP14+EGFR
ALT: 26 IU/L (ref 0–32)
AST: 27 IU/L (ref 0–40)
Albumin/Globulin Ratio: 1.9 (ref 1.2–2.2)
Albumin: 4.5 g/dL (ref 3.8–4.9)
Alkaline Phosphatase: 87 IU/L (ref 39–117)
BUN/Creatinine Ratio: 17 (ref 9–23)
BUN: 15 mg/dL (ref 6–24)
Bilirubin Total: 0.5 mg/dL (ref 0.0–1.2)
CO2: 24 mmol/L (ref 20–29)
Calcium: 9.5 mg/dL (ref 8.7–10.2)
Chloride: 104 mmol/L (ref 96–106)
Creatinine, Ser: 0.88 mg/dL (ref 0.57–1.00)
GFR calc Af Amer: 88 mL/min/{1.73_m2} (ref >59)
GFR calc non Af Amer: 76 mL/min/{1.73_m2} (ref >59)
Globulin, Total: 2.4 g/dL (ref 1.5–4.5)
Glucose: 89 mg/dL (ref 65–99)
Potassium: 4.5 mmol/L (ref 3.5–5.2)
Sodium: 141 mmol/L (ref 134–144)
Total Protein: 6.9 g/dL (ref 6.0–8.5)

## 2019-01-15 LAB — LIPID PANEL
Chol/HDL Ratio: 1.7 ratio (ref 0.0–4.4)
Cholesterol, Total: 209 mg/dL — ABNORMAL HIGH (ref 100–199)
HDL: 125 mg/dL (ref >39)
LDL Calculated: 77 mg/dL (ref 0–99)
Triglycerides: 33 mg/dL (ref 0–149)
VLDL Cholesterol Cal: 7 mg/dL (ref 5–40)

## 2019-01-15 LAB — TSH: TSH: 2.18 u[IU]/mL (ref 0.450–4.500)

## 2019-01-15 LAB — CBC
Hematocrit: 39.7 % (ref 34.0–46.6)
Hemoglobin: 13.4 g/dL (ref 11.1–15.9)
MCH: 30.7 pg (ref 26.6–33.0)
MCHC: 33.8 g/dL (ref 31.5–35.7)
MCV: 91 fL (ref 79–97)
Platelets: 245 10*3/uL (ref 150–450)
RBC: 4.37 x10E6/uL (ref 3.77–5.28)
RDW: 12.2 % (ref 11.7–15.4)
WBC: 4.6 10*3/uL (ref 3.4–10.8)

## 2019-01-20 ENCOUNTER — Other Ambulatory Visit: Payer: Self-pay

## 2019-01-20 ENCOUNTER — Other Ambulatory Visit: Payer: Self-pay | Admitting: Obstetrics and Gynecology

## 2019-01-20 ENCOUNTER — Ambulatory Visit
Admission: RE | Admit: 2019-01-20 | Discharge: 2019-01-20 | Disposition: A | Discharge status: Home / Self Care | Payer: 59 | Source: Ambulatory Visit | Attending: Certified Nurse Midwife | Admitting: Certified Nurse Midwife

## 2019-01-20 DIAGNOSIS — Z1231 Encounter for screening mammogram for malignant neoplasm of breast: Secondary | ICD-10-CM

## 2019-01-20 NOTE — Telephone Encounter (Signed)
Medication refill request: Vivelle Dot 0.5  Last AEX: 06/08/18  Next AEX: 07/11/19 Last MMG (if hormonal medication request): 01/20/19 Bi-rads 1 neg  Refill authorized:  #24 with 1 RF

## 2019-01-20 NOTE — Telephone Encounter (Signed)
Patient is calling to get a refill on Estradiol 0.05 MG/24 HR patch. Patient stated that the medication is going well for her and she is not experiencing any side effects.

## 2019-01-21 MED ORDER — ESTRADIOL 0.05 MG/24HR TD PTTW: 1.0000 | MEDICATED_PATCH | TRANSDERMAL | 1 refills | Status: DC

## 2019-01-24 ENCOUNTER — Other Ambulatory Visit: Payer: Self-pay | Admitting: Obstetrics and Gynecology

## 2019-01-24 NOTE — Telephone Encounter (Signed)
Medication refill request: progesterone  Last AEX:  06-08-18 DL  Next AEX: 06-14-2019  Last MMG (if hormonal medication request): 01-20-2019 density D/BIRADS 1 negative  Refill authorized: Today, please advise.   Medication pended for #90, 0RF. Please refill if appropriate.

## 2019-01-25 NOTE — Telephone Encounter (Signed)
Please contact patient to see how she is doing with her HRT which was initiated in March, 2020.

## 2019-01-26 NOTE — Telephone Encounter (Signed)
Left message to call Nishant Schrecengost, RN at [facility] [phone].   

## 2019-01-27 MED FILL — PROGESTERONE 100 MG CAPSULE: 100 | 90 days supply | Qty: 90 | Fill #0

## 2019-01-27 NOTE — Telephone Encounter (Signed)
Patient is returning a call to Jill. °

## 2019-01-27 NOTE — Telephone Encounter (Signed)
Spoke with patient. Reports HRT is working well for her, hot flashes have decreased, would like to continue mediation. Advised I will update Dr. Quincy Simmonds, confirmed pharmacy on file. Patient thankful for return call.  Routing to Dr. Quincy Simmonds

## 2019-02-01 MED FILL — DOTTI 0.05 MG/24HR PTTW: 0.05 | 84 days supply | Qty: 24 | Fill #0

## 2019-04-05 MED FILL — SHINGRIX 50 MCG SUS: 50 | 1 days supply | Qty: 1 | Fill #0

## 2019-05-04 MED FILL — PROGESTERONE 100 MG CAPSULE: 100 | 90 days supply | Qty: 90 | Fill #1

## 2019-05-04 MED FILL — DOTTI 0.05 MG/24HR PTTW: 0.05 | 84 days supply | Qty: 24 | Fill #1

## 2019-06-14 ENCOUNTER — Ambulatory Visit: Payer: 59 | Admitting: Certified Nurse Midwife

## 2019-06-23 MED FILL — SHINGRIX 50 MCG SUS: 50 | 1 days supply | Qty: 1 | Fill #1

## 2019-06-30 ENCOUNTER — Other Ambulatory Visit: Payer: Self-pay

## 2019-07-04 ENCOUNTER — Other Ambulatory Visit: Payer: Self-pay

## 2019-07-04 ENCOUNTER — Ambulatory Visit (INDEPENDENT_AMBULATORY_CARE_PROVIDER_SITE_OTHER): Payer: 59 | Admitting: Certified Nurse Midwife

## 2019-07-04 ENCOUNTER — Encounter: Payer: Self-pay | Admitting: Certified Nurse Midwife

## 2019-07-04 VITALS — BP 94/60 | HR 64 | Temp 97.1°F | Resp 16 | Ht 67.25 in | Wt 120.0 lb

## 2019-07-04 DIAGNOSIS — Z01419 Encounter for gynecological examination (general) (routine) without abnormal findings: Secondary | ICD-10-CM | POA: Diagnosis not present

## 2019-07-04 DIAGNOSIS — N951 Menopausal and female climacteric states: Secondary | ICD-10-CM | POA: Diagnosis not present

## 2019-07-04 MED ORDER — ESTRADIOL 0.05 MG/24HR TD PTTW: 1.0000 | MEDICATED_PATCH | TRANSDERMAL | 3 refills | Status: DC

## 2019-07-04 MED ORDER — PROGESTERONE MICRONIZED 100 MG PO CAPS: 100.0000 mg | ORAL_CAPSULE | Freq: Every day | ORAL | 3 refills | Status: DC

## 2019-07-04 NOTE — Progress Notes (Signed)
53 y.o. G0P0 Single  Caucasian Fe here for annual exam. Menopausal denies vaginal bleeding. Had flare of hemorrhoid and is better, but now having some vaginal itching. She feels this may be from standing. Continues with HRT and desires continuance. Denies any warning signs with use. Sees PCP for aex, labs, all normal per patient. No health issues today.  Patient's last menstrual period was 05/16/2018 (exact date).          Sexually active: Yes.    The current method of family planning is post menopausal status.    Exercising: Yes.    running & weights Smoker:  no  Review of Systems  Constitutional: Negative.   HENT: Negative.   Eyes: Negative.   Respiratory: Negative.   Cardiovascular: Negative.   Gastrointestinal: Negative.   Genitourinary: Negative.   Musculoskeletal: Negative.   Skin:       External vaginal itching  Neurological: Negative.   Endo/Heme/Allergies: Negative.   Psychiatric/Behavioral: Negative.     Health Maintenance: Pap:  04-15-17 neg HPV HR neg History of Abnormal Pap: no MMG:  01-20-2019 category d density birads 1:neg Self Breast exams: yes Colonoscopy:  2019 f/u 58yrs due to prep BMD:   none TDaP:  2013 Shingles: 2020 Pneumonia: no Hep C and HIV: both neg 2017 Labs: if needed   reports that she has never smoked. She has never used smokeless tobacco. She reports current alcohol use of about 2.0 standard drinks of alcohol per week. She reports that she does not use drugs.  Past Medical History:  Diagnosis Date  . Heart murmur    low grade    Past Surgical History:  Procedure Laterality Date  . WISDOM TOOTH EXTRACTION  age 32    Current Outpatient Medications  Medication Sig Dispense Refill  . B Complex Vitamins (B COMPLEX PO) Take by mouth.    . docusate sodium (COLACE) 100 MG capsule Take 100 mg by mouth at bedtime.    Marland Kitchen estradiol (VIVELLE-DOT) 0.05 MG/24HR patch Place 1 patch (0.05 mg total) onto the skin 2 (two) times a week. 24 patch 1  .  progesterone (PROMETRIUM) 100 MG capsule TAKE 1 CAPSULE BY MOUTH DAILY. 90 capsule 1   No current facility-administered medications for this visit.     Family History  Problem Relation Age of Onset  . Hypertension Maternal Grandfather   . Heart disease Maternal Grandfather   . Cancer Father        leukemia  . Other Mother        Altzheimers  . Other Maternal Grandmother   . Stroke Maternal Grandmother   . Other Paternal Grandmother   . Other Paternal Grandfather     ROS:  Pertinent items are noted in HPI.  Otherwise, a comprehensive ROS was negative.  Exam:   LMP 05/16/2018 (Exact Date)    Ht Readings from Last 3 Encounters:  01/14/19 5' 7.5" (1.715 m)  06/08/18 5' 7.5" (1.715 m)  06/03/18 5\' 7"  (1.702 m)    General appearance: alert, cooperative and appears stated age Head: Normocephalic, without obvious abnormality, atraumatic Neck: no adenopathy, supple, symmetrical, trachea midline and thyroid normal to inspection and palpation Lungs: clear to auscultation bilaterally Breasts: normal appearance, no masses or tenderness, No nipple retraction or dimpling, No nipple discharge or bleeding, No axillary or supraclavicular adenopathy Heart: regular rate and rhythm Abdomen: soft, non-tender; no masses,  no organomegaly Extremities: extremities normal, atraumatic, no cyanosis or edema Skin: Skin color, texture, turgor normal. No rashes or  lesions Lymph nodes: Cervical, supraclavicular, and axillary nodes normal. No abnormal inguinal nodes palpated Neurologic: Grossly normal   Pelvic: External genitalia:  no lesions              Urethra:  normal appearing urethra with no masses, tenderness or lesions              Bartholin's and Skene's: normal                 Vagina: normal appearing vagina with normal color and discharge, no lesions              Cervix: no cervical motion tenderness, no lesions and normal appearance              Pap taken: No. Bimanual Exam:  Uterus:   normal size, contour, position, consistency, mobility, non-tender and anteverted              Adnexa: normal adnexa and no mass, fullness, tenderness               Rectovaginal: Confirms               Anus:  normal sphincter tone, no lesions  Chaperone present: yes  A:  Well Woman with normal exam  Menopausal on HRT working well desires continuance  Hemorrhoid flare, using OTC medication with good results.  P:   Reviewed health and wellness pertinent to exam  Reviewed risks/benefits/warning signs and expectations with HRT. Desires continuance.   Rx Vivelle dot  See order with instructions  Rx Prometrium see order with instructions   Warning signs with hemorrhoids given. Suggested Balmex use to protect tissues.  Pap smear: no   counseled on breast self exam, mammography screening, feminine hygiene, use and side effects of HRT, adequate intake of calcium and vitamin D, diet and exercise  return annually or prn  An After Visit Summary was printed and given to the patient.

## 2019-07-04 NOTE — Patient Instructions (Signed)

## 2019-07-11 MED FILL — DOTTI 0.05 MG/24HR PTTW: 0.05 | 84 days supply | Qty: 24 | Fill #0

## 2019-08-01 MED FILL — PROGESTERONE 100 MG CAPSULE: 100 | 90 days supply | Qty: 90 | Fill #0

## 2019-10-26 DIAGNOSIS — Z872 Personal history of diseases of the skin and subcutaneous tissue: Secondary | ICD-10-CM | POA: Diagnosis not present

## 2019-10-26 DIAGNOSIS — L659 Nonscarring hair loss, unspecified: Secondary | ICD-10-CM | POA: Diagnosis not present

## 2019-10-26 DIAGNOSIS — L650 Telogen effluvium: Secondary | ICD-10-CM | POA: Diagnosis not present

## 2019-10-31 ENCOUNTER — Encounter: Payer: Self-pay | Admitting: Certified Nurse Midwife

## 2019-10-31 MED FILL — PROGESTERONE 100 MG CAPSULE: 100 | 90 days supply | Qty: 90 | Fill #1

## 2019-10-31 MED FILL — DOTTI 0.05 MG/24HR PTTW: 0.05 | 84 days supply | Qty: 24 | Fill #1

## 2020-01-11 DIAGNOSIS — H524 Presbyopia: Secondary | ICD-10-CM | POA: Diagnosis not present

## 2020-01-11 DIAGNOSIS — H52223 Regular astigmatism, bilateral: Secondary | ICD-10-CM | POA: Diagnosis not present

## 2020-01-11 DIAGNOSIS — H5213 Myopia, bilateral: Secondary | ICD-10-CM | POA: Diagnosis not present

## 2020-01-30 MED FILL — DOTTI 0.05 MG/24HR PTTW: 0.05 | 84 days supply | Qty: 24 | Fill #2

## 2020-01-30 MED FILL — PROGESTERONE 100 MG CAPSULE: 100 | 90 days supply | Qty: 90 | Fill #2

## 2020-04-24 MED FILL — PROGESTERONE 100 MG CAPSULE: 100 | 90 days supply | Qty: 90 | Fill #3

## 2020-04-24 MED FILL — DOTTI 0.05 MG/24HR PTTW: 0.05 | 84 days supply | Qty: 24 | Fill #3

## 2020-06-07 DIAGNOSIS — D1039 Benign neoplasm of other parts of mouth: Secondary | ICD-10-CM | POA: Diagnosis not present

## 2020-06-22 ENCOUNTER — Other Ambulatory Visit: Payer: Self-pay | Admitting: Nurse Practitioner

## 2020-06-22 ENCOUNTER — Other Ambulatory Visit: Payer: Self-pay | Admitting: Oral Surgery

## 2020-06-22 DIAGNOSIS — Z1231 Encounter for screening mammogram for malignant neoplasm of breast: Secondary | ICD-10-CM

## 2020-06-22 DIAGNOSIS — D1039 Benign neoplasm of other parts of mouth: Secondary | ICD-10-CM | POA: Diagnosis not present

## 2020-07-02 NOTE — Progress Notes (Signed)
54 y.o. G0P0 Single White or Caucasian female here for annual exam.    Started patch 0.05 twice weekly/prometrium 10/2018. It improved her hot flashes/vasomotor symptoms but doesn't feel it is enough. Still waking up 3 times per night with hot flashes.  Patient's last menstrual period was 05/16/2018 (exact date).    Currently working at a nurse, cardiology         Sexually active: Yes.    The current method of family planning is post menopausal status.    Exercising: Yes.    running, light weights Smoker:  no  Health Maintenance: Pap:  04-15-17 neg HPV HR neg History of abnormal Pap:  no MMG:  01-20-2019 category d density birads 1:neg, scheduled for 08/2020 Colonoscopy:  2019 f/u 39yrs due to prep BMD:   none TDaP:  2013 Gardasil:  n/a Covid-19: pfizer done Pneumonia vaccine(s):  Not done Shingrix:   2020 Hep C testing: neg 2017 Screening Labs: done last year   reports that she has never smoked. She has never used smokeless tobacco. She reports current alcohol use of about 2.0 standard drinks of alcohol per week. She reports that she does not use drugs.  Past Medical History:  Diagnosis Date   Heart murmur    low grade    Past Surgical History:  Procedure Laterality Date   wart removal     from roof of mouth   WISDOM TOOTH EXTRACTION  age 71    Current Outpatient Medications  Medication Sig Dispense Refill   docusate sodium (COLACE) 100 MG capsule Take 100 mg by mouth at bedtime.     estradiol (VIVELLE-DOT) 0.05 MG/24HR patch Place 1 patch (0.05 mg total) onto the skin 2 (two) times a week. 24 patch 3   progesterone (PROMETRIUM) 100 MG capsule Take 1 capsule (100 mg total) by mouth daily. 90 capsule 3   No current facility-administered medications for this visit.    Family History  Problem Relation Age of Onset   Hypertension Maternal Grandfather    Heart disease Maternal Grandfather    Cancer Father        leukemia   Other Mother        Altzheimers    Other Maternal Grandmother    Stroke Maternal Grandmother    Other Paternal Grandmother    Other Paternal Grandfather     Review of Systems  Constitutional:       Hot flashes at night  HENT: Negative.   Eyes: Negative.   Respiratory: Negative.   Cardiovascular: Negative.   Gastrointestinal: Negative.   Endocrine: Negative.   Genitourinary: Negative.   Musculoskeletal: Negative.   Skin: Negative.   Allergic/Immunologic: Negative.   Neurological: Negative.   Hematological: Negative.   Psychiatric/Behavioral: Negative.     Exam:   BP 104/64    Pulse 68    Resp 16    Ht 5' 7.25" (1.708 m)    Wt 123 lb (55.8 kg)    LMP 05/16/2018 (Exact Date)    BMI 19.12 kg/m   Height: 5' 7.25" (170.8 cm)  General appearance: alert, cooperative and appears stated age Head: Normocephalic, without obvious abnormality, atraumatic Neck: no adenopathy, supple, symmetrical, trachea midline and thyroid normal to inspection and palpation Lungs: clear to auscultation bilaterally Breasts: normal appearance, no masses or tenderness Heart: regular rate and rhythm Abdomen: soft, non-tender; bowel sounds normal; no masses,  no organomegaly Extremities: extremities normal, atraumatic, no cyanosis or edema Skin: Skin color, texture, turgor normal. No rashes or lesions  Lymph nodes: Cervical, supraclavicular, and axillary nodes normal. No abnormal inguinal nodes palpated Neurologic: Grossly normal   Pelvic: External genitalia:  no lesions              Urethra:  normal appearing urethra with no masses, tenderness or lesions              Bartholins and Skenes: normal                 Vagina: normal appearing vagina with normal color and discharge, no lesions              Cervix: no cervical motion tenderness and no lesions              Pap taken: No. Bimanual Exam:  Uterus:  normal size, contour, position, consistency, mobility, non-tender              Adnexa: no mass, fullness, tenderness                Rectovaginal: Confirms               Anus:  normal sphincter tone, no lesions  Larene Beach, CMA Chaperone was present for exam.  A:  Well Woman with normal exam  Vasomotor symptoms of menopause  P:   Mammogram scheduled for 08/2020  pap smear/cotesting due 2023  Screening labs not done this year, not indicated  Increase Estradiol patch to 0.075mg  twice weekly,  continue Prometrium 100mg  daily  return annually or prn

## 2020-07-04 ENCOUNTER — Ambulatory Visit: Payer: 59 | Admitting: Certified Nurse Midwife

## 2020-07-09 ENCOUNTER — Ambulatory Visit (INDEPENDENT_AMBULATORY_CARE_PROVIDER_SITE_OTHER): Payer: 59 | Admitting: Nurse Practitioner

## 2020-07-09 ENCOUNTER — Other Ambulatory Visit: Payer: Self-pay

## 2020-07-09 ENCOUNTER — Other Ambulatory Visit: Payer: Self-pay | Admitting: Nurse Practitioner

## 2020-07-09 ENCOUNTER — Encounter: Payer: Self-pay | Admitting: Nurse Practitioner

## 2020-07-09 VITALS — BP 104/64 | HR 68 | Resp 16 | Ht 67.25 in | Wt 123.0 lb

## 2020-07-09 DIAGNOSIS — N951 Menopausal and female climacteric states: Secondary | ICD-10-CM | POA: Diagnosis not present

## 2020-07-09 DIAGNOSIS — Z01419 Encounter for gynecological examination (general) (routine) without abnormal findings: Secondary | ICD-10-CM | POA: Diagnosis not present

## 2020-07-09 MED ORDER — ESTRADIOL 0.075 MG/24HR TD PTTW: 1.0000 | MEDICATED_PATCH | TRANSDERMAL | 4 refills | Status: DC

## 2020-07-09 MED ORDER — PROGESTERONE MICRONIZED 100 MG PO CAPS: 100.0000 mg | ORAL_CAPSULE | Freq: Every day | ORAL | 4 refills | Status: DC

## 2020-07-09 MED FILL — ESTRADIOL 0.075 MG PATCH: 0.075 | 84 days supply | Qty: 24 | Fill #0

## 2020-07-09 NOTE — Patient Instructions (Signed)
Health Maintenance for Postmenopausal Women Menopause is a normal process in which your ability to get pregnant comes to an end. This process happens slowly over many months or years, usually between the ages of 48 and 55. Menopause is complete when you have missed your menstrual periods for 12 months. It is important to talk with your health care provider about some of the most common conditions that affect women after menopause (postmenopausal women). These include heart disease, cancer, and bone loss (osteoporosis). Adopting a healthy lifestyle and getting preventive care can help to promote your health and wellness. The actions you take can also lower your chances of developing some of these common conditions. What should I know about menopause? During menopause, you may get a number of symptoms, such as:  Hot flashes. These can be moderate or severe.  Night sweats.  Decrease in sex drive.  Mood swings.  Headaches.  Tiredness.  Irritability.  Memory problems.  Insomnia. Choosing to treat or not to treat these symptoms is a decision that you make with your health care provider. Do I need hormone replacement therapy?  Hormone replacement therapy is effective in treating symptoms that are caused by menopause, such as hot flashes and night sweats.  Hormone replacement carries certain risks, especially as you become older. If you are thinking about using estrogen or estrogen with progestin, discuss the benefits and risks with your health care provider. What is my risk for heart disease and stroke? The risk of heart disease, heart attack, and stroke increases as you age. One of the causes may be a change in the body's hormones during menopause. This can affect how your body uses dietary fats, triglycerides, and cholesterol. Heart attack and stroke are medical emergencies. There are many things that you can do to help prevent heart disease and stroke. Watch your blood pressure  High  blood pressure causes heart disease and increases the risk of stroke. This is more likely to develop in people who have high blood pressure readings, are of African descent, or are overweight.  Have your blood pressure checked: ? Every 3-5 years if you are 18-39 years of age. ? Every year if you are 40 years old or older. Eat a healthy diet   Eat a diet that includes plenty of vegetables, fruits, low-fat dairy products, and lean protein.  Do not eat a lot of foods that are high in solid fats, added sugars, or sodium. Get regular exercise Get regular exercise. This is one of the most important things you can do for your health. Most adults should:  Try to exercise for at least 150 minutes each week. The exercise should increase your heart rate and make you sweat (moderate-intensity exercise).  Try to do strengthening exercises at least twice each week. Do these in addition to the moderate-intensity exercise.  Spend less time sitting. Even light physical activity can be beneficial. Other tips  Work with your health care provider to achieve or maintain a healthy weight.  Do not use any products that contain nicotine or tobacco, such as cigarettes, e-cigarettes, and chewing tobacco. If you need help quitting, ask your health care provider.  Know your numbers. Ask your health care provider to check your cholesterol and your blood sugar (glucose). Continue to have your blood tested as directed by your health care provider. Do I need screening for cancer? Depending on your health history and family history, you may need to have cancer screening at different stages of your life. This   may include screening for:  Breast cancer.  Cervical cancer.  Lung cancer.  Colorectal cancer. What is my risk for osteoporosis? After menopause, you may be at increased risk for osteoporosis. Osteoporosis is a condition in which bone destruction happens more quickly than new bone creation. To help prevent  osteoporosis or the bone fractures that can happen because of osteoporosis, you may take the following actions:  If you are 59-34 years old, get at least 1,000 mg of calcium and at least 600 mg of vitamin D per day.  If you are older than age 64 but younger than age 62, get at least 1,200 mg of calcium and at least 600 mg of vitamin D per day.  If you are older than age 43, get at least 1,200 mg of calcium and at least 800 mg of vitamin D per day. Smoking and drinking excessive alcohol increase the risk of osteoporosis. Eat foods that are rich in calcium and vitamin D, and do weight-bearing exercises several times each week as directed by your health care provider. How does menopause affect my mental health? Depression may occur at any age, but it is more common as you become older. Common symptoms of depression include:  Low or sad mood.  Changes in sleep patterns.  Changes in appetite or eating patterns.  Feeling an overall lack of motivation or enjoyment of activities that you previously enjoyed.  Frequent crying spells. Talk with your health care provider if you think that you are experiencing depression. General instructions See your health care provider for regular wellness exams and vaccines. This may include:  Scheduling regular health, dental, and eye exams.  Getting and maintaining your vaccines. These include: ? Influenza vaccine. Get this vaccine each year before the flu season begins. ? Pneumonia vaccine. ? Shingles vaccine. ? Tetanus, diphtheria, and pertussis (Tdap) booster vaccine. Your health care provider may also recommend other immunizations. Tell your health care provider if you have ever been abused or do not feel safe at home. Summary  Menopause is a normal process in which your ability to get pregnant comes to an end.  This condition causes hot flashes, night sweats, decreased interest in sex, mood swings, headaches, or lack of sleep.  Treatment for this  condition may include hormone replacement therapy.  Take actions to keep yourself healthy, including exercising regularly, eating a healthy diet, watching your weight, and checking your blood pressure and blood sugar levels.  Get screened for cancer and depression. Make sure that you are up to date with all your vaccines. This information is not intended to replace advice given to you by your health care provider. Make sure you discuss any questions you have with your health care provider. Document Revised: 07/21/2018 Document Reviewed: 07/21/2018 Elsevier Patient Education  2020 [employer].   Menopause and Hormone Replacement Therapy Menopause is a normal time of life when menstrual periods stop completely and the ovaries stop producing the female hormones estrogen and progesterone. This lack of hormones can affect your health and cause undesirable symptoms. Hormone replacement therapy (HRT) can relieve some of those symptoms. What is hormone replacement therapy? HRT is the use of artificial (synthetic) hormones to replace hormones that your body has stopped producing because you have reached menopause. What are my options for HRT?  HRT may consist of the synthetic hormones estrogen and progestin, or it may consist of only estrogen (estrogen-only therapy). You and your health care provider will decide which form of HRT is best for  you. If you choose to be on HRT and you have a uterus, estrogen and progestin are usually prescribed. Estrogen-only therapy is used for women who do not have a uterus. Possible options for taking HRT include:  Pills.  Patches.  Gels.  Sprays.  Vaginal cream.  Vaginal rings.  Vaginal inserts. The amount of hormone(s) that you take and how long you take the hormone(s) varies according to your health. It is important to:  Begin HRT with the lowest possible dosage.  Stop HRT as soon as your health care provider tells you to stop.  Work with your  health care provider so that you feel informed and comfortable with your decisions. What are the benefits of HRT? HRT can reduce the frequency and severity of menopausal symptoms. Benefits of HRT vary according to the kind of symptoms that you have, how severe they are, and your overall health. HRT may help to improve the following symptoms of menopause:  Hot flashes and night sweats. These are sudden feelings of heat that spread over the face and body. The skin may turn red, like a blush. Night sweats are hot flashes that happen while you are sleeping or trying to sleep.  Bone loss (osteoporosis). The body loses calcium more quickly after menopause, causing the bones to become weaker. This can increase the risk for bone breaks (fractures).  Vaginal dryness. The lining of the vagina can become thin and dry, which can cause pain during sex or cause infection, burning, or itching.  Urinary tract infections.  Urinary incontinence. This is the inability to control when you pass urine.  Irritability.  Short-term memory problems. What are the risks of HRT? Risks of HRT vary depending on your individual health and medical history. Risks of HRT also depend on whether you receive both estrogen and progestin or you receive estrogen only. HRT may increase the risk of:  Spotting. This is when a small amount of blood leaks from the vagina unexpectedly.  Endometrial cancer. This cancer is in the lining of the uterus (endometrium).  Breast cancer.  Increased density of breast tissue. This can make it harder to find breast cancer on a breast X-ray (mammogram).  Stroke.  Heart disease.  Blood clots.  Gallbladder disease.  Liver disease. Risks of HRT can increase if you have any of the following conditions:  Endometrial cancer.  Liver disease.  Heart disease.  Breast cancer.  History of blood clots.  History of stroke. Follow these instructions at home:  Take over-the-counter and  prescription medicines only as told by your health care provider.  Get mammograms, pelvic exams, and medical checkups as often as told by your health care provider.  Have Pap tests done as often as told by your health care provider. A Pap test is sometimes called a Pap smear. It is a screening test that is used to check for signs of cancer of the cervix and vagina. A Pap test can also identify the presence of infection or precancerous changes. Pap tests may be done: ? Every 3 years, starting at age 60. ? Every 5 years, starting after age 92, in combination with testing for human papillomavirus (HPV). ? More often or less often depending on other medical conditions you have, your age, and other risk factors.  It is up to you to get the results of your Pap test. Ask your health care provider, or the department that is doing the test, when your results will be ready.  Keep all follow-up visits  as told by your health care provider. This is important. Contact a health care provider if you have:  Pain or swelling in your legs.  Shortness of breath.  Chest pain.  Lumps or changes in your breasts or armpits.  Slurred speech.  Pain, burning, or bleeding when you urinate.  Unusual vaginal bleeding.  Dizziness or headaches.  Weakness or numbness in any part of your arms or legs.  Pain in your abdomen. Summary  Menopause is a normal time of life when menstrual periods stop completely and the ovaries stop producing the female hormones estrogen and progesterone.  Hormone replacement therapy (HRT) can relieve some of the symptoms of menopause.  HRT can reduce the frequency and severity of menopausal symptoms.  Risks of HRT vary depending on your individual health and medical history. This information is not intended to replace advice given to you by your health care provider. Make sure you discuss any questions you have with your health care provider. Document Revised: 03/30/2018 Document  Reviewed: 03/30/2018 Elsevier Patient Education  2020 [employer].

## 2020-07-23 MED FILL — PROGESTERONE 100 MG CAPSULE: 100 | 90 days supply | Qty: 90 | Fill #0

## 2020-08-06 ENCOUNTER — Ambulatory Visit: Payer: 59

## 2020-08-17 ENCOUNTER — Other Ambulatory Visit: Payer: Self-pay

## 2020-08-17 ENCOUNTER — Ambulatory Visit
Admission: RE | Admit: 2020-08-17 | Discharge: 2020-08-17 | Disposition: A | Discharge status: Home / Self Care | Payer: 59 | Source: Ambulatory Visit | Attending: Nurse Practitioner | Admitting: Nurse Practitioner

## 2020-08-17 DIAGNOSIS — Z1231 Encounter for screening mammogram for malignant neoplasm of breast: Secondary | ICD-10-CM | POA: Diagnosis not present

## 2020-09-25 ENCOUNTER — Ambulatory Visit: Payer: 59 | Admitting: Obstetrics and Gynecology

## 2020-10-02 MED FILL — ESTRADIOL 0.075 MG PATCH: 0.075 | 84 days supply | Qty: 24 | Fill #1

## 2020-12-18 ENCOUNTER — Ambulatory Visit: Payer: 59 | Admitting: Nurse Practitioner

## 2020-12-18 ENCOUNTER — Encounter: Payer: Self-pay | Admitting: Nurse Practitioner

## 2020-12-18 ENCOUNTER — Other Ambulatory Visit: Payer: Self-pay

## 2020-12-18 VITALS — BP 104/70 | HR 57 | Temp 97.6°F | Ht 66.8 in | Wt 120.0 lb

## 2020-12-18 DIAGNOSIS — Z0000 Encounter for general adult medical examination without abnormal findings: Secondary | ICD-10-CM

## 2020-12-18 DIAGNOSIS — Z7689 Persons encountering health services in other specified circumstances: Secondary | ICD-10-CM | POA: Diagnosis not present

## 2020-12-18 NOTE — Patient Instructions (Signed)
Health Maintenance, Female Adopting a healthy lifestyle and getting preventive care are important in promoting health and wellness. Ask your health care provider about:  The right schedule for you to have regular tests and exams.  Things you can do on your own to prevent diseases and keep yourself healthy. What should I know about diet, weight, and exercise? Eat a healthy diet  Eat a diet that includes plenty of vegetables, fruits, low-fat dairy products, and lean protein.  Do not eat a lot of foods that are high in solid fats, added sugars, or sodium.   Maintain a healthy weight Body mass index (BMI) is used to identify weight problems. It estimates body fat based on height and weight. Your health care provider can help determine your BMI and help you achieve or maintain a healthy weight. Get regular exercise Get regular exercise. This is one of the most important things you can do for your health. Most adults should:  Exercise for at least 150 minutes each week. The exercise should increase your heart rate and make you sweat (moderate-intensity exercise).  Do strengthening exercises at least twice a week. This is in addition to the moderate-intensity exercise.  Spend less time sitting. Even light physical activity can be beneficial. Watch cholesterol and blood lipids Have your blood tested for lipids and cholesterol at 54 years of age, then have this test every 5 years. Have your cholesterol levels checked more often if:  Your lipid or cholesterol levels are high.  You are older than 54 years of age.  You are at high risk for heart disease. What should I know about cancer screening? Depending on your health history and family history, you may need to have cancer screening at various ages. This may include screening for:  Breast cancer.  Cervical cancer.  Colorectal cancer.  Skin cancer.  Lung cancer. What should I know about heart disease, diabetes, and high blood  pressure? Blood pressure and heart disease  High blood pressure causes heart disease and increases the risk of stroke. This is more likely to develop in people who have high blood pressure readings, are of African descent, or are overweight.  Have your blood pressure checked: ? Every 3-5 years if you are 18-39 years of age. ? Every year if you are 40 years old or older. Diabetes Have regular diabetes screenings. This checks your fasting blood sugar level. Have the screening done:  Once every three years after age 40 if you are at a normal weight and have a low risk for diabetes.  More often and at a younger age if you are overweight or have a high risk for diabetes. What should I know about preventing infection? Hepatitis B If you have a higher risk for hepatitis B, you should be screened for this virus. Talk with your health care provider to find out if you are at risk for hepatitis B infection. Hepatitis C Testing is recommended for:  Everyone born from 1945 through 1965.  Anyone with known risk factors for hepatitis C. Sexually transmitted infections (STIs)  Get screened for STIs, including gonorrhea and chlamydia, if: ? You are sexually active and are younger than 54 years of age. ? You are older than 54 years of age and your health care provider tells you that you are at risk for this type of infection. ? Your sexual activity has changed since you were last screened, and you are at increased risk for chlamydia or gonorrhea. Ask your health care provider   if you are at risk.  Ask your health care provider about whether you are at high risk for HIV. Your health care provider may recommend a prescription medicine to help prevent HIV infection. If you choose to take medicine to prevent HIV, you should first get tested for HIV. You should then be tested every 3 months for as long as you are taking the medicine. Pregnancy  If you are about to stop having your period (premenopausal) and  you may become pregnant, seek counseling before you get pregnant.  Take 400 to 800 micrograms (mcg) of folic acid every day if you become pregnant.  Ask for birth control (contraception) if you want to prevent pregnancy. Osteoporosis and menopause Osteoporosis is a disease in which the bones lose minerals and strength with aging. This can result in bone fractures. If you are 65 years old or older, or if you are at risk for osteoporosis and fractures, ask your health care provider if you should:  Be screened for bone loss.  Take a calcium or vitamin D supplement to lower your risk of fractures.  Be given hormone replacement therapy (HRT) to treat symptoms of menopause. Follow these instructions at home: Lifestyle  Do not use any products that contain nicotine or tobacco, such as cigarettes, e-cigarettes, and chewing tobacco. If you need help quitting, ask your health care provider.  Do not use street drugs.  Do not share needles.  Ask your health care provider for help if you need support or information about quitting drugs. Alcohol use  Do not drink alcohol if: ? Your health care provider tells you not to drink. ? You are pregnant, may be pregnant, or are planning to become pregnant.  If you drink alcohol: ? Limit how much you use to 0-1 drink a day. ? Limit intake if you are breastfeeding.  Be aware of how much alcohol is in your drink. In the [place] one drink equals one 12 oz bottle of beer (355 mL), one 5 oz glass of wine (148 mL), or one 1 oz glass of hard liquor (44 mL). General instructions  Schedule regular health, dental, and eye exams.  Stay current with your vaccines.  Tell your health care provider if: ? You often feel depressed. ? You have ever been abused or do not feel safe at home. Summary  Adopting a healthy lifestyle and getting preventive care are important in promoting health and wellness.  Follow your health care provider's instructions about healthy  diet, exercising, and getting tested or screened for diseases.  Follow your health care provider's instructions on monitoring your cholesterol and blood pressure. This information is not intended to replace advice given to you by your health care provider. Make sure you discuss any questions you have with your health care provider. Document Revised: 07/21/2018 Document Reviewed: 07/21/2018 Elsevier Patient Education  2021 [employer]  

## 2020-12-18 NOTE — Progress Notes (Signed)
I,Yamilka Roman [employer] as a Education administrator for [employer], FNP.,have documented all relevant documentation on the behalf of [name], FNP,as directed by  [name], FNP while in the presence of [name], [place]. This visit occurred during the SARS-CoV-2 public health emergency.  Safety protocols were in place, including screening questions prior to the visit, additional usage of staff PPE, and extensive cleaning of exam room while observing appropriate contact time as indicated for disinfecting solutions.  Subjective:     Patient ID: Marie Avila , female    DOB: January 13, 1966 , 55 y.o.   MRN: [id number]   Chief Complaint  Patient presents with  . Annual Exam  . Establish Care    HPI  Patient presents today to establish primary care. She stated she use to go to [place] but they closed. She had seen them approximately 1 year ago. 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  She graduated from [employer]. She had also been in the [place], no children. She would like to have her physical done. She has a GYN at [facility]  [place] [facility] - she was seen by the cardiologist.   Mother diagnosed with alzheimer's at 59, deceased at 53. One sister healthy. Paternal grandmother alzheimer's.     Past Medical History:  Diagnosis Date  . Heart murmur    low grade     Family History  Problem Relation Age of Onset  . Hypertension Maternal Grandfather   . Heart disease Maternal Grandfather   . Cancer Father        leukemia  . Other Mother        Altzheimers  . Other Maternal Grandmother   . Stroke Maternal Grandmother   . Alzheimer's disease Paternal Grandmother   . Cirrhosis Paternal Grandfather      Current Outpatient Medications:  .  docusate sodium (COLACE) 100 MG capsule, Take 100 mg by mouth at bedtime., Disp: , Rfl:  .  estradiol (VIVELLE-DOT) 0.075 MG/24HR, PLACE 1 PATCH ONTO THE SKIN 2 (TWO) TIMES A WEEK., Disp: 24 patch, Rfl: 4 .   progesterone (PROMETRIUM) 100 MG capsule, Take 1 capsule (100 mg total) by mouth daily., Disp: 90 capsule, Rfl: 3   Allergies  Allergen Reactions  . Bee Venom   . Penicillins       The patient states she uses oral progesterone-only contraceptive for birth control.  Patient's last menstrual period was 05/16/2018 (exact date).. Negative for Dysmenorrhea and Negative for Menorrhagia. Negative for: breast discharge, breast lump(s), breast pain and breast self exam. Associated symptoms include abnormal vaginal bleeding. Pertinent negatives include abnormal bleeding (hematology), anxiety, decreased libido, depression, difficulty falling sleep, dyspareunia, history of infertility, nocturia, sexual dysfunction, sleep disturbances, urinary incontinence, urinary urgency, vaginal discharge and vaginal itching. Diet vegetarian.The patient states her exercise level is moderate.    The patient's tobacco use is:  Social History   Tobacco Use  Smoking Status Never Smoker  Smokeless Tobacco Never Used  . She has been exposed to passive smoke. The patient's alcohol use is:  Social History   Substance and Sexual Activity  Alcohol Use Yes  . Alcohol/week: 2.0 standard drinks  . Types: 2 Standard drinks or equivalent per week   Comment: social   Additional information: Last pap 2018, next one scheduled for now  Review of Systems  Constitutional: Negative.   HENT: Negative.   Eyes: Negative.   Respiratory: Negative.   Cardiovascular: Negative.   Gastrointestinal: Negative.  Endocrine: Negative.   Genitourinary: Negative.   Musculoskeletal: Negative.   Skin: Negative.   Allergic/Immunologic: Negative.   Neurological: Negative.   Hematological: Negative.   Psychiatric/Behavioral: Negative.      Today's Vitals   12/18/20 1139  BP: 104/70  Pulse: (!) 57  Temp: 97.6 F (36.4 C)  TempSrc: Oral  Weight: 120 lb (54.4 kg)  Height: 5' 6.8" (1.697 m)  PainSc: 0-No pain   Body mass index is  18.91 kg/m.   Objective:  Physical Exam Vitals reviewed.  Constitutional:      General: She is not in acute distress.    Appearance: Normal appearance. She is well-developed. She is obese.  HENT:     Head: Normocephalic and atraumatic.     Right Ear: Hearing, tympanic membrane, ear canal and external ear normal. There is no impacted cerumen.     Left Ear: Hearing, tympanic membrane, ear canal and external ear normal. There is no impacted cerumen.     Nose:     Comments: Deferred - masked    Mouth/Throat:     Comments: Deferred - masked Eyes:     General: Lids are normal.     Extraocular Movements: Extraocular movements intact.     Conjunctiva/sclera: Conjunctivae normal.     Pupils: Pupils are equal, round, and reactive to light.     Funduscopic exam:    Right eye: No papilledema.        Left eye: No papilledema.  Neck:     Thyroid: No thyroid mass.     Vascular: No carotid bruit.  Cardiovascular:     Rate and Rhythm: Normal rate and regular rhythm.     Pulses: Normal pulses.     Heart sounds: Normal heart sounds. No murmur heard.   Pulmonary:     Effort: Pulmonary effort is normal. No respiratory distress.     Breath sounds: Normal breath sounds. No wheezing.  Chest:     Chest wall: No mass.  Breasts:     Tanner Score is 5.     Right: Normal. No mass, tenderness, axillary adenopathy or supraclavicular adenopathy.     Left: Normal. No mass, tenderness, axillary adenopathy or supraclavicular adenopathy.    Abdominal:     General: Abdomen is flat. Bowel sounds are normal. There is no distension.     Palpations: Abdomen is soft.     Tenderness: There is no abdominal tenderness.  Genitourinary:    Rectum: Guaiac result negative.  Musculoskeletal:        General: No swelling or tenderness. Normal range of motion.     Cervical back: Full passive range of motion without pain, normal range of motion and neck supple.     Right lower leg: No edema.     Left lower leg: No  edema.  Lymphadenopathy:     Upper Body:     Right upper body: No supraclavicular, axillary or pectoral adenopathy.     Left upper body: No supraclavicular, axillary or pectoral adenopathy.  Skin:    General: Skin is warm and dry.     Capillary Refill: Capillary refill takes less than 2 seconds.  Neurological:     General: No focal deficit present.     Mental Status: She is alert and oriented to person, place, and time.     Cranial Nerves: No cranial nerve deficit.     Sensory: No sensory deficit.  Psychiatric:        Mood and Affect: Mood normal.  Behavior: Behavior normal.        Thought Content: Thought content normal.        Judgment: Judgment normal.         Assessment And Plan:     1. Encounter for general adult medical examination w/o abnormal findings . Behavior modifications discussed and diet history reviewed.   . Pt will continue to exercise regularly and modify diet with low GI, plant based foods and decrease intake of processed foods.  . Recommend intake of daily multivitamin, Vitamin D, and calcium.  . Recommend mammogram and colonoscopy (both up to date) for preventive screenings, as well as recommend immunizations that include influenza, TDAP - CMP14+EGFR - CBC - VITAMIN D 25 Hydroxy (Vit-D Deficiency, Fractures) - Lipid panel  2. Establishing care with new doctor, encounter for     Patient was given opportunity to ask questions. Patient verbalized understanding of the plan and was able to repeat key elements of the plan. All questions were answered to their satisfaction.   [name], FNP   I, [name], FNP, have reviewed all documentation for this visit. The documentation on 12/18/20 for the exam, diagnosis, procedures, and orders are all accurate and complete.   THE PATIENT IS ENCOURAGED TO PRACTICE SOCIAL DISTANCING DUE TO THE COVID-19 PANDEMIC.

## 2020-12-19 LAB — CBC
Hematocrit: 40.1 % (ref 34.0–46.6)
Hemoglobin: 13.5 g/dL (ref 11.1–15.9)
MCH: 32.1 pg (ref 26.6–33.0)
MCHC: 33.7 g/dL (ref 31.5–35.7)
MCV: 95 fL (ref 79–97)
Platelets: 232 10*3/uL (ref 150–450)
RBC: 4.21 x10E6/uL (ref 3.77–5.28)
RDW: 11.8 % (ref 11.7–15.4)
WBC: 6.4 10*3/uL (ref 3.4–10.8)

## 2020-12-19 LAB — CMP14+EGFR
ALT: 12 IU/L (ref 0–32)
AST: 15 IU/L (ref 0–40)
Albumin/Globulin Ratio: 1.9 (ref 1.2–2.2)
Albumin: 4.8 g/dL (ref 3.8–4.9)
Alkaline Phosphatase: 49 IU/L (ref 44–121)
BUN/Creatinine Ratio: 24 — ABNORMAL HIGH (ref 9–23)
BUN: 19 mg/dL (ref 6–24)
Bilirubin Total: 0.3 mg/dL (ref 0.0–1.2)
CO2: 26 mmol/L (ref 20–29)
Calcium: 9.3 mg/dL (ref 8.7–10.2)
Chloride: 102 mmol/L (ref 96–106)
Creatinine, Ser: 0.78 mg/dL (ref 0.57–1.00)
Globulin, Total: 2.5 g/dL (ref 1.5–4.5)
Glucose: 86 mg/dL (ref 65–99)
Potassium: 4.2 mmol/L (ref 3.5–5.2)
Sodium: 139 mmol/L (ref 134–144)
Total Protein: 7.3 g/dL (ref 6.0–8.5)
eGFR: 91 mL/min/{1.73_m2} (ref >59)

## 2020-12-19 LAB — VITAMIN D 25 HYDROXY (VIT D DEFICIENCY, FRACTURES): Vit D, 25-Hydroxy: 53.6 ng/mL (ref 30.0–100.0)

## 2020-12-19 LAB — LIPID PANEL
Chol/HDL Ratio: 1.6 ratio (ref 0.0–4.4)
Cholesterol, Total: 191 mg/dL (ref 100–199)
HDL: 119 mg/dL (ref >39)
LDL Chol Calc (NIH): 63 mg/dL (ref 0–99)
Triglycerides: 41 mg/dL (ref 0–149)
VLDL Cholesterol Cal: 9 mg/dL (ref 5–40)

## 2020-12-26 ENCOUNTER — Other Ambulatory Visit (HOSPITAL_COMMUNITY): Payer: Self-pay

## 2020-12-26 MED FILL — Estradiol TD Patch Twice Weekly 0.075 MG/24HR: TRANSDERMAL | 84 days supply | Qty: 24 | Fill #0 | Status: AC

## 2021-01-25 ENCOUNTER — Other Ambulatory Visit (HOSPITAL_COMMUNITY): Payer: Self-pay

## 2021-01-25 MED FILL — Progesterone Cap 100 MG: ORAL | 90 days supply | Qty: 90 | Fill #0 | Status: AC

## 2021-03-20 ENCOUNTER — Other Ambulatory Visit (HOSPITAL_COMMUNITY): Payer: Self-pay

## 2021-03-20 MED FILL — Estradiol TD Patch Twice Weekly 0.075 MG/24HR: TRANSDERMAL | 84 days supply | Qty: 24 | Fill #1 | Status: AC

## 2021-04-25 ENCOUNTER — Other Ambulatory Visit (HOSPITAL_COMMUNITY): Payer: Self-pay

## 2021-04-25 MED FILL — Progesterone Cap 100 MG: ORAL | 90 days supply | Qty: 90 | Fill #1 | Status: AC

## 2021-04-26 ENCOUNTER — Other Ambulatory Visit (HOSPITAL_COMMUNITY): Payer: Self-pay

## 2021-06-12 ENCOUNTER — Other Ambulatory Visit (HOSPITAL_COMMUNITY): Payer: Self-pay

## 2021-06-12 MED FILL — Estradiol TD Patch Twice Weekly 0.075 MG/24HR: TRANSDERMAL | 84 days supply | Qty: 24 | Fill #2 | Status: AC

## 2021-06-13 ENCOUNTER — Other Ambulatory Visit (HOSPITAL_COMMUNITY): Payer: Self-pay

## 2021-07-11 ENCOUNTER — Ambulatory Visit: Payer: 59 | Admitting: Obstetrics and Gynecology

## 2021-07-11 ENCOUNTER — Ambulatory Visit: Payer: 59 | Admitting: Nurse Practitioner

## 2021-07-23 ENCOUNTER — Other Ambulatory Visit: Payer: Self-pay

## 2021-07-23 ENCOUNTER — Other Ambulatory Visit (HOSPITAL_COMMUNITY): Payer: Self-pay

## 2021-07-24 ENCOUNTER — Other Ambulatory Visit (HOSPITAL_COMMUNITY): Payer: Self-pay

## 2021-07-24 ENCOUNTER — Other Ambulatory Visit: Payer: Self-pay

## 2021-07-24 DIAGNOSIS — N951 Menopausal and female climacteric states: Secondary | ICD-10-CM

## 2021-07-24 MED ORDER — PROGESTERONE MICRONIZED 100 MG PO CAPS
100.0000 mg | ORAL_CAPSULE | Freq: Every day | ORAL | 0 refills | Status: DC
  Filled 2021-07-24: qty 30, 30d supply, fill #0

## 2021-07-24 NOTE — Telephone Encounter (Signed)
Last AEX 07/09/20 with Karma Ganja, NP. Scheduled AEX 08/01/21 with Dr. Quincy Simmonds.   Normal mammo 08/17/20.

## 2021-07-30 NOTE — Progress Notes (Signed)
55 y.o. G0P0 Single Caucasian female here for annual exam.    Patient would like to switch from estrogen patch to pill form.  Patch starting to irritate skin. Still some hot flashes at night and in the am.  Spotting every couple of months.  This may be occurring with the patch peeling back and not having good adhesion.   PCP:  Juanda Crumble, PA-C   Patient's last menstrual period was 05/16/2018 (exact date).           Sexually active: Yes.    The current method of family planning is post menopausal status.    Exercising: Yes.     Running and weights Smoker:  no  Health Maintenance: Pap:  04-15-17 Neg:Neg HR HPV, 12-13-13 Neg:Neg HR HPV History of abnormal Pap:  no MMG:  08-17-20 Neg/Birads1 Colonoscopy:  2019 f/u 64yrs due to prep BMD:   n/a  Result  n/a TDaP:  2013 Gardasil:   no HIV: 02-14-16 NR Hep C: 02-14-16 Neg Screening Labs:   PCP.   reports that she has never smoked. She has never used smokeless tobacco. She reports current alcohol use of about 2.0 standard drinks per week. She reports that she does not use drugs.  Past Medical History:  Diagnosis Date   Heart murmur    low grade    Past Surgical History:  Procedure Laterality Date   wart removal     from roof of mouth   WISDOM TOOTH EXTRACTION  age 50    Current Outpatient Medications  Medication Sig Dispense Refill   estradiol (VIVELLE-DOT) 0.075 MG/24HR PLACE 1 PATCH ONTO THE SKIN 2 (TWO) TIMES A WEEK. 24 patch 4   progesterone (PROMETRIUM) 100 MG capsule Take 1 capsule (100 mg total) by mouth daily. 30 capsule 0   No current facility-administered medications for this visit.    Family History  Problem Relation Age of Onset   Hypertension Maternal Grandfather    Heart disease Maternal Grandfather    Cancer Father        leukemia   Other Mother        Altzheimers   Other Maternal Grandmother    Stroke Maternal Grandmother    Alzheimer's disease Paternal Grandmother    Cirrhosis Paternal Grandfather      Review of Systems  All other systems reviewed and are negative.  Exam:   BP 108/68    Pulse 74    Ht 5\' 7"  (1.702 m)    Wt 120 lb (54.4 kg)    LMP 05/16/2018 (Exact Date)    SpO2 98%    BMI 18.79 kg/m     General appearance: alert, cooperative and appears stated age Head: normocephalic, without obvious abnormality, atraumatic Neck: no adenopathy, supple, symmetrical, trachea midline and thyroid normal to inspection and palpation Lungs: clear to auscultation bilaterally Breasts: normal appearance, no masses or tenderness, No nipple retraction or dimpling, No nipple discharge or bleeding, No axillary adenopathy Heart: regular rate and rhythm Abdomen: soft, non-tender; no masses, no organomegaly Extremities: extremities normal, atraumatic, no cyanosis or edema Skin: skin color, texture, turgor normal. No rashes or lesions Lymph nodes: cervical, supraclavicular, and axillary nodes normal. Neurologic: grossly normal  Pelvic: External genitalia:  no lesions              No abnormal inguinal nodes palpated.              Urethra:  normal appearing urethra with no masses, tenderness or lesions  Bartholins and Skenes: normal                 Vagina: normal appearing vagina with normal color and discharge, no lesions              Cervix: no lesions.  Brown blood.               Pap taken: no Bimanual Exam:  Uterus:  normal size, contour, position, consistency, mobility, non-tender              Adnexa: no mass, fullness, tenderness              Rectal exam: yes.  Confirms.              Anus:  normal sphincter tone, no lesions  Chaperone was present for exam: Estill Bamberg, CMA  Assessment:   Well woman visit with gynecologic exam. HRT. Intermittent bleeding.  Patch not adhering.  Plan: Mammogram screening discussed. Self breast awareness reviewed. Pap and HR HPV as above. Guidelines for Calcium, Vitamin D, regular exercise program including cardiovascular and weight bearing  exercise. Discused WHI and use of HRT which can increase risk of PE, DVT, MI, stroke and breast cancer.  She will continue Prometrium and switch her estrogen to Estrace 1 mg daily. Will do pelvic US if spotting persists with change in HRT. Follow up annually and prn.    After visit summary provided.

## 2021-07-31 ENCOUNTER — Telehealth: Payer: Self-pay | Admitting: *Deleted

## 2021-07-31 ENCOUNTER — Other Ambulatory Visit (HOSPITAL_COMMUNITY): Payer: Self-pay

## 2021-07-31 NOTE — Telephone Encounter (Signed)
Patient annual exam scheduled tomorrow reports she started spotting today and wanted to know okay to keep appointment. I informed patient okay to keep appointment, per Karma Ganja, NP note her next pap due 2023

## 2021-08-01 ENCOUNTER — Ambulatory Visit (INDEPENDENT_AMBULATORY_CARE_PROVIDER_SITE_OTHER): Payer: 59 | Admitting: Obstetrics and Gynecology

## 2021-08-01 ENCOUNTER — Encounter: Payer: Self-pay | Admitting: Obstetrics and Gynecology

## 2021-08-01 ENCOUNTER — Other Ambulatory Visit (HOSPITAL_COMMUNITY): Payer: Self-pay

## 2021-08-01 ENCOUNTER — Other Ambulatory Visit: Payer: Self-pay

## 2021-08-01 VITALS — BP 108/68 | HR 74 | Ht 67.0 in | Wt 120.0 lb

## 2021-08-01 DIAGNOSIS — N951 Menopausal and female climacteric states: Secondary | ICD-10-CM

## 2021-08-01 DIAGNOSIS — Z01419 Encounter for gynecological examination (general) (routine) without abnormal findings: Secondary | ICD-10-CM

## 2021-08-01 MED ORDER — PROGESTERONE MICRONIZED 100 MG PO CAPS
100.0000 mg | ORAL_CAPSULE | Freq: Every day | ORAL | 3 refills | Status: DC
  Filled 2021-08-01 – 2021-08-27 (×2): qty 90, 90d supply, fill #0
  Filled 2021-11-25: qty 90, 90d supply, fill #1
  Filled 2022-02-19: qty 90, 90d supply, fill #2
  Filled 2022-05-22: qty 90, 90d supply, fill #3

## 2021-08-01 MED ORDER — ESTRADIOL 1 MG PO TABS
1.0000 mg | ORAL_TABLET | Freq: Every day | ORAL | 3 refills | Status: DC
  Filled 2021-08-01: qty 90, 90d supply, fill #0
  Filled 2021-11-05: qty 90, 90d supply, fill #1
  Filled 2022-01-31: qty 90, 90d supply, fill #2
  Filled 2022-05-01: qty 90, 90d supply, fill #3

## 2021-08-01 NOTE — Patient Instructions (Signed)

## 2021-08-02 ENCOUNTER — Other Ambulatory Visit (HOSPITAL_COMMUNITY): Payer: Self-pay

## 2021-08-15 ENCOUNTER — Other Ambulatory Visit: Payer: Self-pay | Admitting: Obstetrics and Gynecology

## 2021-08-15 DIAGNOSIS — Z1231 Encounter for screening mammogram for malignant neoplasm of breast: Secondary | ICD-10-CM

## 2021-08-27 ENCOUNTER — Other Ambulatory Visit (HOSPITAL_COMMUNITY): Payer: Self-pay

## 2021-09-06 ENCOUNTER — Ambulatory Visit
Admission: RE | Admit: 2021-09-06 | Discharge: 2021-09-06 | Disposition: A | Discharge status: Home / Self Care | Payer: 59 | Source: Ambulatory Visit

## 2021-09-06 DIAGNOSIS — Z1231 Encounter for screening mammogram for malignant neoplasm of breast: Secondary | ICD-10-CM | POA: Diagnosis not present

## 2021-09-26 ENCOUNTER — Ambulatory Visit: Payer: 59 | Attending: Internal Medicine

## 2021-09-26 ENCOUNTER — Other Ambulatory Visit (HOSPITAL_BASED_OUTPATIENT_CLINIC_OR_DEPARTMENT_OTHER): Payer: Self-pay

## 2021-09-26 DIAGNOSIS — Z23 Encounter for immunization: Secondary | ICD-10-CM

## 2021-09-26 MED ORDER — PFIZER COVID-19 VAC BIVALENT 30 MCG/0.3ML IM SUSP
INTRAMUSCULAR | 0 refills | Status: DC
  Filled 2021-09-26: qty 0.3, 1d supply, fill #0

## 2021-09-26 NOTE — Progress Notes (Signed)
° °  Covid-19 Vaccination Clinic  Name:  Marie Avila    MRN: [id number] DOB: [birth year]  09/26/2021  [name] was observed post Covid-19 immunization for 15 minutes without incident. She was provided with Vaccine Information Sheet and instruction to access the V-Safe system.   [name] was instructed to call 911 with any severe reactions post vaccine: Difficulty breathing  Swelling of face and throat  A fast heartbeat  A bad rash all over body  Dizziness and weakness   Immunizations Administered     Name Date Dose VIS Date Route   Pfizer Covid-19 Vaccine Bivalent Booster 09/26/2021 11:33 AM 0.3 mL 04/10/2021 Intramuscular   Manufacturer: [place]   Lot: [id number]   [place]: [phone]

## 2021-11-05 ENCOUNTER — Other Ambulatory Visit (HOSPITAL_COMMUNITY): Payer: Self-pay

## 2021-11-25 ENCOUNTER — Other Ambulatory Visit (HOSPITAL_COMMUNITY): Payer: Self-pay

## 2021-12-19 ENCOUNTER — Ambulatory Visit (INDEPENDENT_AMBULATORY_CARE_PROVIDER_SITE_OTHER): Payer: 59 | Admitting: Nurse Practitioner

## 2021-12-19 ENCOUNTER — Encounter: Payer: Self-pay | Admitting: Nurse Practitioner

## 2021-12-19 VITALS — BP 116/62 | HR 59 | Temp 98.2°F | Ht 67.0 in | Wt 119.0 lb

## 2021-12-19 DIAGNOSIS — Z23 Encounter for immunization: Secondary | ICD-10-CM | POA: Diagnosis not present

## 2021-12-19 DIAGNOSIS — Z0000 Encounter for general adult medical examination without abnormal findings: Secondary | ICD-10-CM

## 2021-12-19 NOTE — Patient Instructions (Signed)

## 2021-12-19 NOTE — Progress Notes (Signed)
I,Marie Avila,acting as a Education administrator for [employer], FNP.,have documented all relevant documentation on the behalf of Marie Brine, FNP,as directed by  Marie Brine, FNP while in the presence of Marie Avila, [place].  This visit occurred during the SARS-CoV-2 public health emergency.  Safety protocols were in place, including screening questions prior to the visit, additional usage of staff PPE, and extensive cleaning of exam room while observing appropriate contact time as indicated for disinfecting solutions.  Subjective:     Patient ID: Marie Avila , female    DOB: 07/25/1966 , 56 y.o.   MRN: [id number]   Chief Complaint  Patient presents with   Annual Exam    HPI  Patient presents today for physical exam. She has [facility] G[facility] at [facility]    Past Medical History:  Diagnosis Date   Heart murmur    low grade     Family History  Problem Relation Age of Onset   Hypertension Maternal Grandfather    Heart disease Maternal Grandfather    Cancer Father        leukemia   Other Mother        Altzheimers   Other Maternal Grandmother    Stroke Maternal Grandmother    Alzheimer's disease Paternal Grandmother    Cirrhosis Paternal Grandfather      Current Outpatient Medications:    estradiol (ESTRACE) 1 MG tablet, Take 1 tablet (1 mg total) by mouth daily., Disp: 90 tablet, Rfl: 3   progesterone (PROMETRIUM) 100 MG capsule, Take 1 capsule (100 mg total) by mouth daily., Disp: 90 capsule, Rfl: 3   COVID-19 mRNA bivalent vaccine, Pfizer, (PFIZER COVID-19 VAC BIVALENT) injection, Inject into the muscle., Disp: 0.3 mL, Rfl: 0   Allergies  Allergen Reactions   Bee Venom    Penicillin G Hives   Penicillins       The patient states she is post menopausal status.  Patient's last menstrual period was 05/16/2018 (exact date).. Negative for Dysmenorrhea and Negative for Menorrhagia. Negative for: breast discharge, breast lump(s), breast pain and breast self exam. Associated  symptoms include abnormal vaginal bleeding. Pertinent negatives include abnormal bleeding (hematology), anxiety, decreased libido, depression, difficulty falling sleep, dyspareunia, history of infertility, nocturia, sexual dysfunction, sleep disturbances, urinary incontinence, urinary urgency, vaginal discharge and vaginal itching. Diet vegetarian.The patient states her exercise level is run 3 days a week and gym 2-3 times a week  The patient's tobacco use is:  Social History   Tobacco Use  Smoking Status Never  Smokeless Tobacco Never   She has been exposed to passive smoke. The patient's alcohol use is:  Social History   Substance and Sexual Activity  Alcohol Use Yes   Alcohol/week: 2.0 standard drinks   Types: 2 Standard drinks or equivalent per week   Comment: social   Additional information: Last pap 2018, next one scheduled for due this year.    Review of Systems  Constitutional: Negative.   HENT: Negative.    Eyes: Negative.   Respiratory: Negative.    Cardiovascular: Negative.   Gastrointestinal: Negative.   Endocrine: Negative.   Genitourinary: Negative.   Musculoskeletal: Negative.   Skin: Negative.   Allergic/Immunologic: Negative.   Neurological: Negative.   Hematological: Negative.   Psychiatric/Behavioral: Negative.      Today's Vitals   12/19/21 1114  BP: 116/62  Pulse: (!) 59  Temp: 98.2 F (36.8 C)  TempSrc: Oral  Weight: 119 lb (54 kg)  Height: 5' 7"  (1.702 m)  Body mass index is 18.64 kg/m.  Wt Readings from Last 3 Encounters:  12/19/21 119 lb (54 kg)  08/01/21 120 lb (54.4 kg)  12/18/20 120 lb (54.4 kg)    Objective:  Physical Exam Vitals reviewed.  Constitutional:      General: She is not in acute distress.    Appearance: Normal appearance. She is well-developed.  HENT:     Head: Normocephalic and atraumatic.     Right Ear: Hearing, tympanic membrane, ear canal and external ear normal. There is no impacted cerumen.     Left Ear:  Hearing, tympanic membrane, ear canal and external ear normal. There is no impacted cerumen.     Nose: Nose normal.     Mouth/Throat:     Mouth: Mucous membranes are moist.  Eyes:     General: Lids are normal.     Extraocular Movements: Extraocular movements intact.     Conjunctiva/sclera: Conjunctivae normal.     Pupils: Pupils are equal, round, and reactive to light.     Funduscopic exam:    Right eye: No papilledema.        Left eye: No papilledema.  Neck:     Thyroid: No thyroid mass.     Vascular: No carotid bruit.  Cardiovascular:     Rate and Rhythm: Normal rate and regular rhythm.     Pulses: Normal pulses.     Heart sounds: Normal heart sounds. No murmur heard. Pulmonary:     Effort: Pulmonary effort is normal. No respiratory distress.     Breath sounds: Normal breath sounds. No wheezing.  Chest:     Chest wall: No mass.  Breasts:    Tanner Score is 5.     Right: Normal. No mass or tenderness.     Left: Normal. No mass or tenderness.  Abdominal:     General: Abdomen is flat. Bowel sounds are normal. There is no distension.     Palpations: Abdomen is soft.     Tenderness: There is no abdominal tenderness.  Genitourinary:    Comments: Deferred - followed by GYN Musculoskeletal:        General: No swelling or tenderness. Normal range of motion.     Cervical back: Full passive range of motion without pain, normal range of motion and neck supple. No tenderness.     Right lower leg: No edema.     Left lower leg: No edema.  Lymphadenopathy:     Upper Body:     Right upper body: No supraclavicular, axillary or pectoral adenopathy.     Left upper body: No supraclavicular, axillary or pectoral adenopathy.  Skin:    General: Skin is warm and dry.     Capillary Refill: Capillary refill takes less than 2 seconds.  Neurological:     General: No focal deficit present.     Mental Status: She is alert and oriented to person, place, and time.     Cranial Nerves: No cranial  nerve deficit.     Sensory: No sensory deficit.  Psychiatric:        Mood and Affect: Mood normal.        Behavior: Behavior normal.        Thought Content: Thought content normal.        Judgment: Judgment normal.        Assessment And Plan:     1. Encounter for general adult medical examination w/o abnormal findings Behavior modifications discussed and diet history reviewed.   Pt  will continue to exercise regularly and modify diet with low GI, plant based foods and decrease intake of processed foods.  Recommend intake of daily multivitamin, Vitamin D, and calcium.  Recommend mammogram and colonoscopy (up to date) for preventive screenings, as well as recommend immunizations that include influenza, TDAP, and Shingles - CBC - Hemoglobin A1c - CMP14+EGFR - Lipid panel  2. Need for Tdap vaccination Will give tetanus vaccine today while in office. Refer to order management. TDAP will be administered to adults 74-33 years old every 10 years. - Tdap vaccine greater than or equal to 7yo IM     Patient was given opportunity to ask questions. Patient verbalized understanding of the plan and was able to repeat key elements of the plan. All questions were answered to their satisfaction.   Marie Brine, FNP   I, Marie Brine, FNP, have reviewed all documentation for this visit. The documentation on 12/19/21 for the exam, diagnosis, procedures, and orders are all accurate and complete.  THE PATIENT IS ENCOURAGED TO PRACTICE SOCIAL DISTANCING DUE TO THE COVID-19 PANDEMIC.

## 2021-12-20 LAB — CBC
Hematocrit: 39 % (ref 34.0–46.6)
Hemoglobin: 13.2 g/dL (ref 11.1–15.9)
MCH: 32.4 pg (ref 26.6–33.0)
MCHC: 33.8 g/dL (ref 31.5–35.7)
MCV: 96 fL (ref 79–97)
Platelets: 245 10*3/uL (ref 150–450)
RBC: 4.08 x10E6/uL (ref 3.77–5.28)
RDW: 12 % (ref 11.7–15.4)
WBC: 5.9 10*3/uL (ref 3.4–10.8)

## 2021-12-20 LAB — HEMOGLOBIN A1C
Est. average glucose Bld gHb Est-mCnc: 108 mg/dL
Hgb A1c MFr Bld: 5.4 % (ref 4.8–5.6)

## 2021-12-20 LAB — CMP14+EGFR
ALT: 22 IU/L (ref 0–32)
AST: 21 IU/L (ref 0–40)
Albumin/Globulin Ratio: 1.6 (ref 1.2–2.2)
Albumin: 4.4 g/dL (ref 3.8–4.9)
Alkaline Phosphatase: 57 IU/L (ref 44–121)
BUN/Creatinine Ratio: 18 (ref 9–23)
BUN: 14 mg/dL (ref 6–24)
Bilirubin Total: 0.3 mg/dL (ref 0.0–1.2)
CO2: 24 mmol/L (ref 20–29)
Calcium: 9.5 mg/dL (ref 8.7–10.2)
Chloride: 101 mmol/L (ref 96–106)
Creatinine, Ser: 0.79 mg/dL (ref 0.57–1.00)
Globulin, Total: 2.7 g/dL (ref 1.5–4.5)
Glucose: 80 mg/dL (ref 70–99)
Potassium: 4.5 mmol/L (ref 3.5–5.2)
Sodium: 138 mmol/L (ref 134–144)
Total Protein: 7.1 g/dL (ref 6.0–8.5)
eGFR: 89 mL/min/{1.73_m2} (ref >59)

## 2021-12-20 LAB — LIPID PANEL
Chol/HDL Ratio: 1.8 ratio (ref 0.0–4.4)
Cholesterol, Total: 173 mg/dL (ref 100–199)
HDL: 95 mg/dL (ref >39)
LDL Chol Calc (NIH): 66 mg/dL (ref 0–99)
Triglycerides: 61 mg/dL (ref 0–149)
VLDL Cholesterol Cal: 12 mg/dL (ref 5–40)

## 2022-01-07 IMAGING — MG DIGITAL SCREENING BILAT W/ TOMO W/ CAD
8 series · 9 of 24 positions shown · non-contrast
Comparison: Previous exam(s).

CLINICAL DATA: Screening.

EXAM:
DIGITAL SCREENING BILATERAL MAMMOGRAM WITH TOMO AND CAD

[L CC synth-2D]
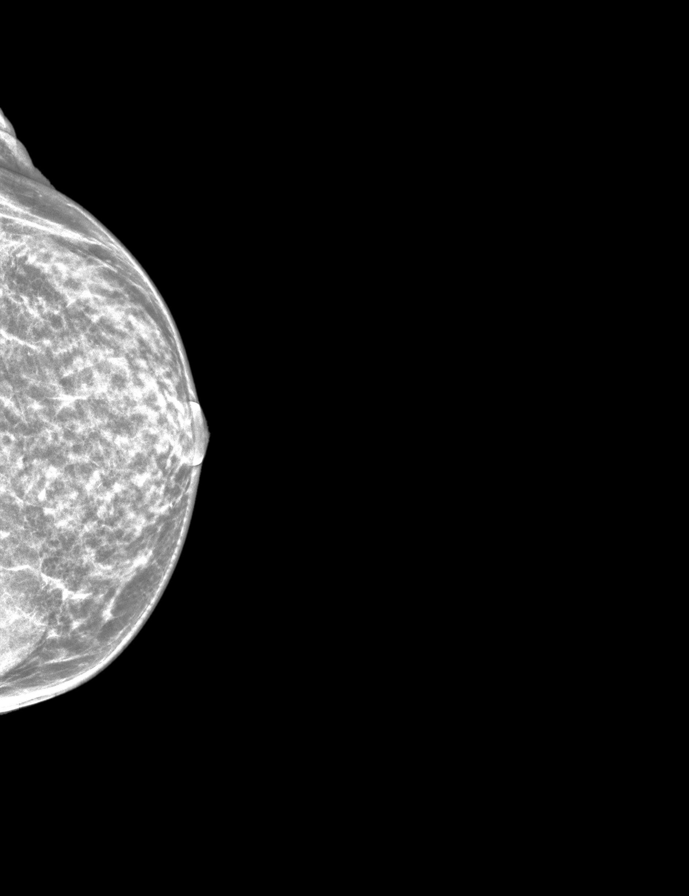

[R CC synth-2D]
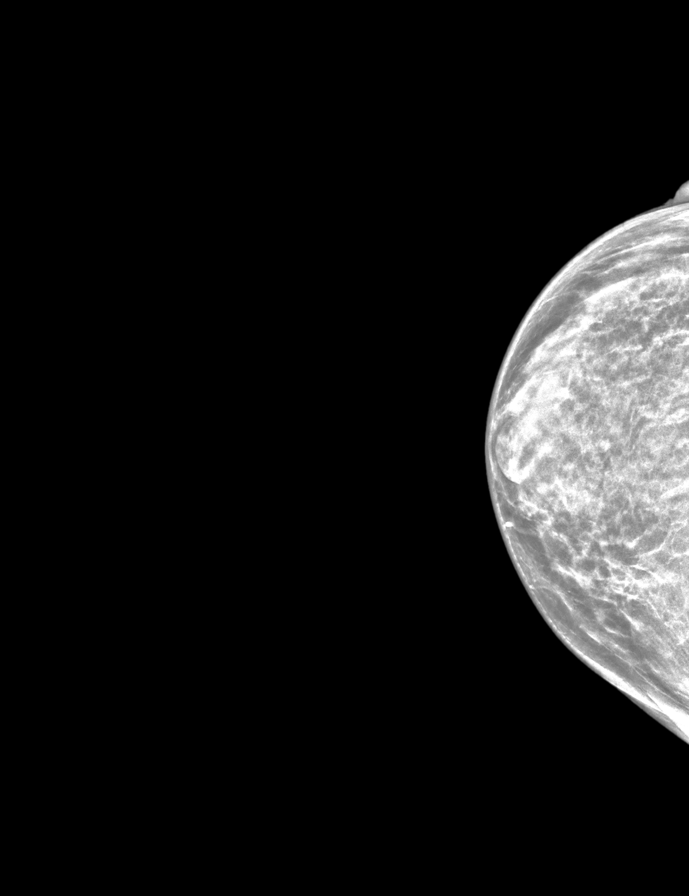

[L MLO synth-2D]
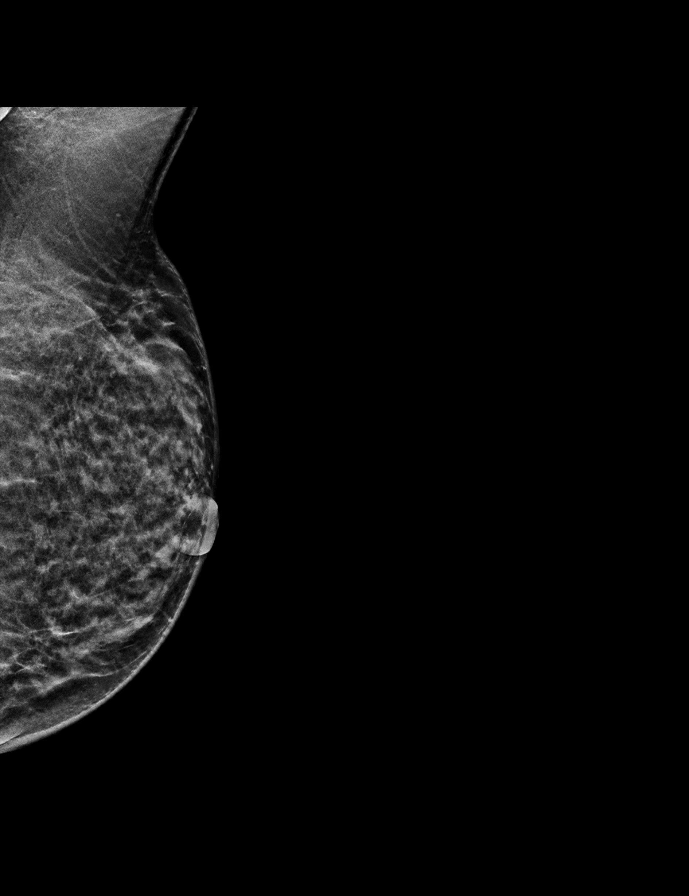

[R MLO synth-2D]
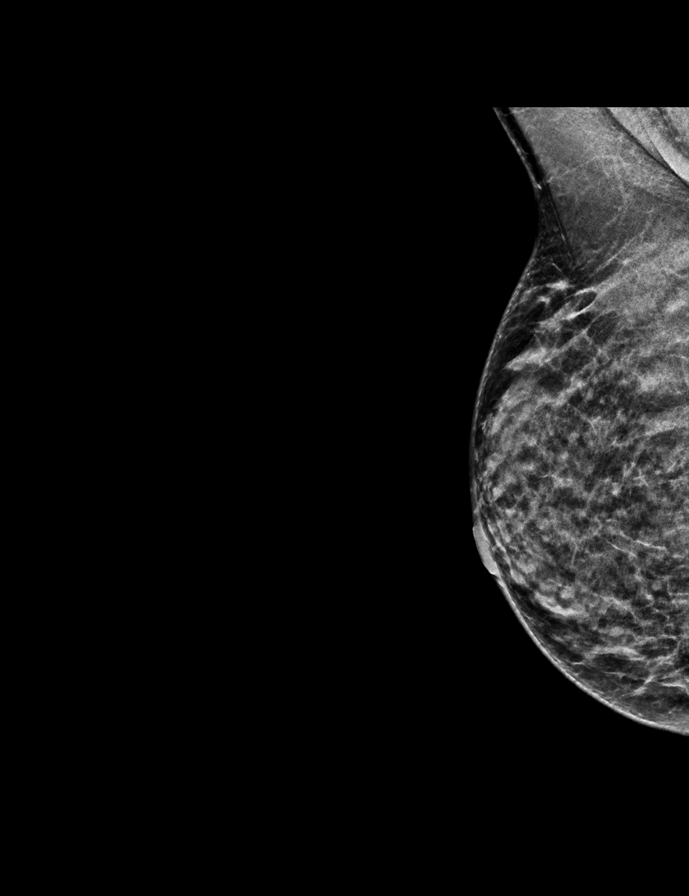

[R CC tomo · 2 of 38 frames shown]
[frame 13/38]
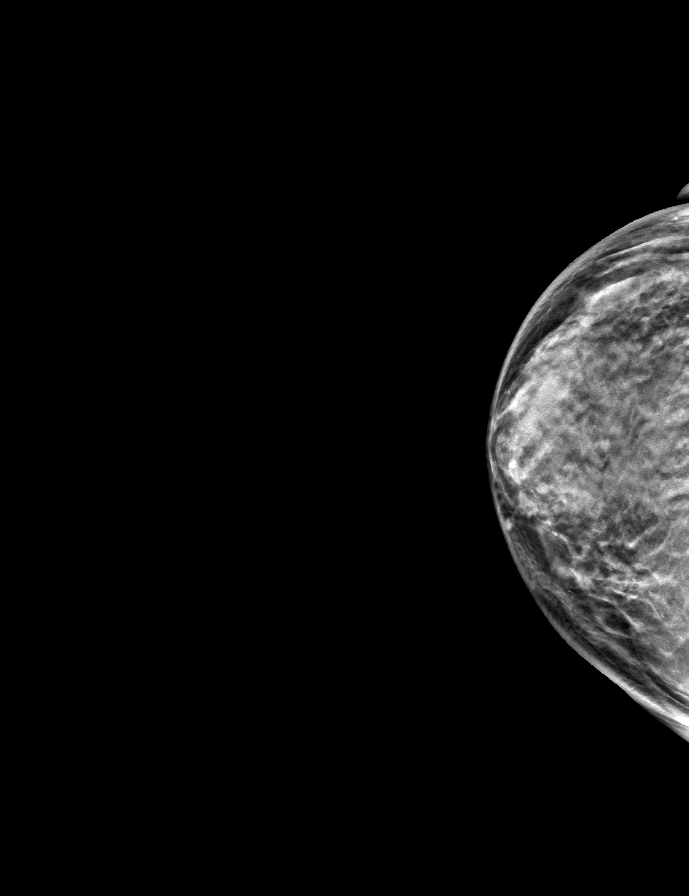
[frame 19/38]
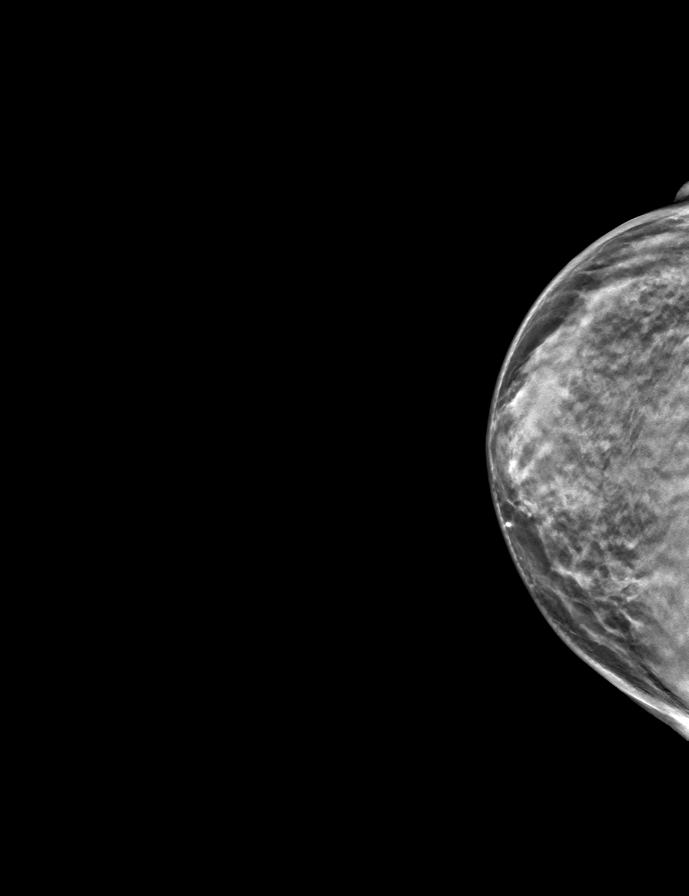

[L CC tomo · tomo slice 17/34.0]
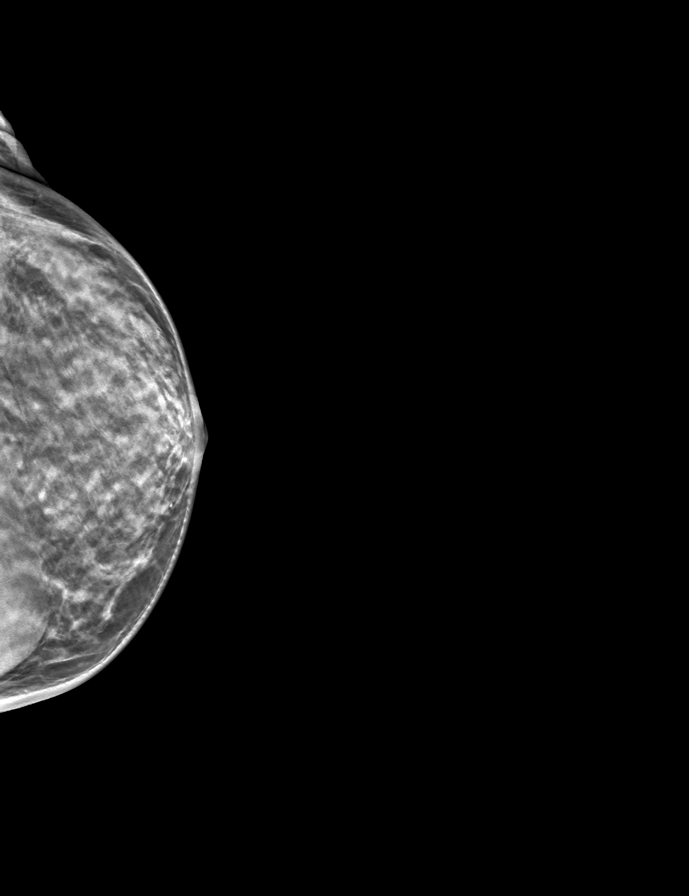

[R MLO tomo · tomo slice 18/35.0]
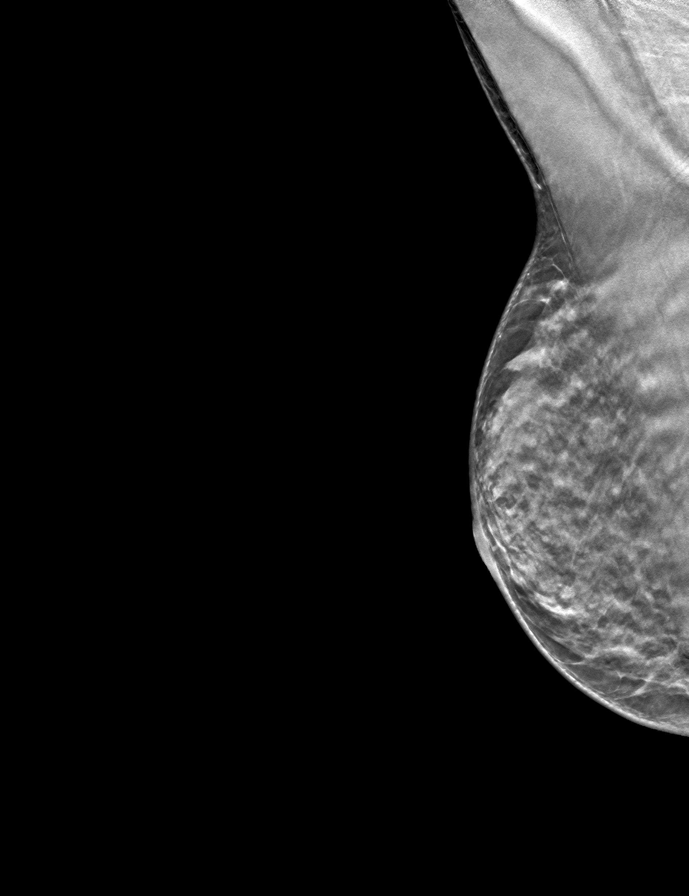

[L MLO tomo · tomo slice 17/33.0]
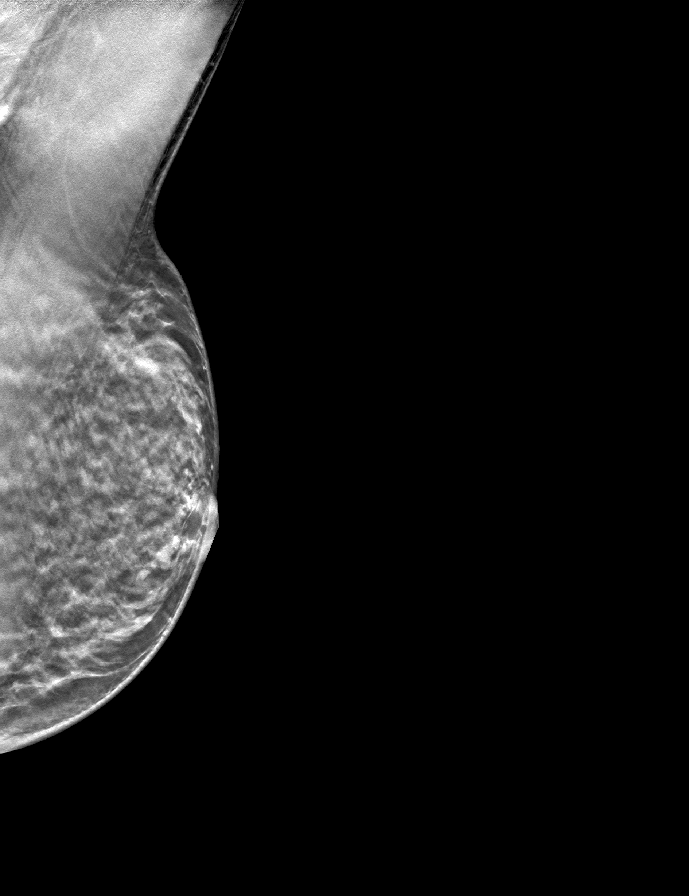

[9 of 24 positions shown; findings below may reference images not displayed]

ACR Breast Density Category d: The breast tissue is extremely dense,
which lowers the sensitivity of mammography
FINDINGS: There are no findings suspicious for malignancy. Images were
processed with CAD.
IMPRESSION: No mammographic evidence of malignancy. A result letter of this
screening mammogram will be mailed directly to the patient.

RECOMMENDATION:
Screening mammogram in one year. (Code:WO-0-ZI0)

BI-RADS CATEGORY  1: Negative.

## 2022-01-31 ENCOUNTER — Other Ambulatory Visit (HOSPITAL_COMMUNITY): Payer: Self-pay

## 2022-02-20 ENCOUNTER — Other Ambulatory Visit (HOSPITAL_COMMUNITY): Payer: Self-pay

## 2022-02-26 ENCOUNTER — Other Ambulatory Visit (HOSPITAL_COMMUNITY): Payer: Self-pay

## 2022-03-25 DIAGNOSIS — H524 Presbyopia: Secondary | ICD-10-CM | POA: Diagnosis not present

## 2022-05-01 ENCOUNTER — Other Ambulatory Visit (HOSPITAL_COMMUNITY): Payer: Self-pay

## 2022-05-22 ENCOUNTER — Other Ambulatory Visit (HOSPITAL_COMMUNITY): Payer: Self-pay

## 2022-05-27 ENCOUNTER — Other Ambulatory Visit (HOSPITAL_COMMUNITY): Payer: Self-pay

## 2022-06-17 ENCOUNTER — Other Ambulatory Visit (HOSPITAL_BASED_OUTPATIENT_CLINIC_OR_DEPARTMENT_OTHER): Payer: Self-pay

## 2022-06-17 MED ORDER — COMIRNATY 30 MCG/0.3ML IM SUSY
PREFILLED_SYRINGE | INTRAMUSCULAR | 0 refills | Status: DC
  Filled 2022-06-17: qty 0.3, 1d supply, fill #0

## 2022-07-28 ENCOUNTER — Other Ambulatory Visit: Payer: Self-pay | Admitting: Obstetrics and Gynecology

## 2022-07-29 NOTE — Telephone Encounter (Signed)
Medication refill request: estradiol  Last AEX:  08/01/21 Next AEX: 08/13/22 Last MMG (if hormonal medication request): 09/06/21 density D Bi-rads 1 neg  Refill authorized: #[id number] pended for today to get her to [facility]

## 2022-07-30 ENCOUNTER — Other Ambulatory Visit (HOSPITAL_COMMUNITY): Payer: Self-pay

## 2022-07-30 MED ORDER — ESTRADIOL 1 MG PO TABS
1.0000 mg | ORAL_TABLET | Freq: Every day | ORAL | 0 refills | Status: DC
  Filled 2022-07-30: qty 30, 30d supply, fill #0

## 2022-07-30 NOTE — Progress Notes (Signed)
56 y.o. G0P0 Single Caucasian female here for annual exam.    No bleeding since changing to oral HRT. Menopause symptoms are better controlled.  Wants to continue HRT.   No health changes.   PCP:   [name], FNP  Patient's last menstrual period was 05/16/2018 (exact date).           Sexually active: Yes.    The current method of family planning is post menopausal status.    Exercising: Yes.     Run 4 days a week Smoker:  no  Health Maintenance: Pap:  04-15-17 Neg:Neg HR HPV, 12-13-13 Neg:Neg HR HPV  History of abnormal Pap:  no MMG:  09/06/21 Breast Density Category D, BI-RADS CATEGORY 1 Negative.  She will schedule.  Colonoscopy:  2019 f/u 81yr due to prep  Dr. MCollene Mares  BMD:   n/a  Result  n/a TDaP:  May, 2023 Gardasil:   no HIV: 02/14/16 NR Hep C: 02/14/16 Neg Screening Labs:  PCP Flu vaccine:  completed.  Covid vaccine:  completed.   reports that she has never smoked. She has never used smokeless tobacco. She reports current alcohol use of about 2.0 standard drinks of alcohol per week. She reports that she does not use drugs.  Past Medical History:  Diagnosis Date   Heart murmur    low grade    Past Surgical History:  Procedure Laterality Date   wart removal     from roof of mouth   WISDOM TOOTH EXTRACTION  age 56   Current Outpatient Medications  Medication Sig Dispense Refill   COVID-19 mRNA bivalent vaccine, Pfizer, (PFIZER COVID-19 VAC BIVALENT) injection Inject into the muscle. 0.3 mL 0   COVID-19 mRNA vaccine 2023-2024 (COMIRNATY) syringe Inject into the muscle. 0.3 mL 0   estradiol (ESTRACE) 1 MG tablet Take 1 tablet (1 mg total) by mouth daily. 30 tablet 0   progesterone (PROMETRIUM) 100 MG capsule Take 1 capsule (100 mg total) by mouth daily. 90 capsule 3   No current facility-administered medications for this visit.    Family History  Problem Relation Age of Onset   Hypertension Maternal Grandfather    Heart disease Maternal Grandfather    Cancer  Father        leukemia   Other Mother        Altzheimers   Other Maternal Grandmother    Stroke Maternal Grandmother    Alzheimer's disease Paternal Grandmother    Cirrhosis Paternal Grandfather     Review of Systems  All other systems reviewed and are negative.   Exam:   BP 118/84 (BP Location: Left Arm, Patient Position: Sitting, Cuff Size: Normal)   Pulse 64   Ht '5\' 7"'$  (1.702 m)   Wt 121 lb (54.9 kg)   LMP 05/16/2018 (Exact Date)   SpO2 100%   BMI 18.95 kg/m     General appearance: alert, cooperative and appears stated age Head: normocephalic, without obvious abnormality, atraumatic Neck: no adenopathy, supple, symmetrical, trachea midline and thyroid normal to inspection and palpation Lungs: clear to auscultation bilaterally Breasts: normal appearance, no masses or tenderness, No nipple retraction or dimpling, No nipple discharge or bleeding, No axillary adenopathy Heart: regular rate and rhythm Abdomen: soft, non-tender; no masses, no organomegaly Extremities: extremities normal, atraumatic, no cyanosis or edema Skin: skin color, texture, turgor normal. No rashes or lesions Lymph nodes: cervical, supraclavicular, and axillary nodes normal. Neurologic: grossly normal  Pelvic: External genitalia:  no lesions  No abnormal inguinal nodes palpated.              Urethra:  normal appearing urethra with no masses, tenderness or lesions              Bartholins and Skenes: normal                 Vagina: normal appearing vagina with normal color and discharge, no lesions              Cervix: no lesions              Pap taken: yes Bimanual Exam:  Uterus:  normal size, contour, position, consistency, mobility, non-tender              Adnexa: no mass, fullness, tenderness              Rectal exam: yes.  Confirms.              Anus:  normal sphincter tone, no lesions  Chaperone was present for exam:  Emily  Assessment:   Well woman visit with gynecologic  exam. HRT.  Colon cancer screening.   Plan: Mammogram screening discussed. Self breast awareness reviewed. Pap and HR HPV as above. Guidelines for Calcium, Vitamin D, regular exercise program including cardiovascular and weight bearing exercise. Discused WHI and use of HRT which can increase risk of PE, DVT, MI, stroke and breast cancer.  Rx for Estradiol and Prometrium for one year.   Cologuard ordered. Follow up annually and prn.   After visit summary provided.

## 2022-08-13 ENCOUNTER — Encounter: Payer: Self-pay | Admitting: Obstetrics and Gynecology

## 2022-08-13 ENCOUNTER — Other Ambulatory Visit (HOSPITAL_COMMUNITY): Payer: Self-pay

## 2022-08-13 ENCOUNTER — Other Ambulatory Visit (HOSPITAL_COMMUNITY)
Admission: RE | Admit: 2022-08-13 | Discharge: 2022-08-13 | Disposition: A | Discharge status: Home / Self Care | Payer: Commercial Managed Care - PPO | Source: Ambulatory Visit | Attending: Obstetrics and Gynecology | Admitting: Obstetrics and Gynecology

## 2022-08-13 ENCOUNTER — Ambulatory Visit (INDEPENDENT_AMBULATORY_CARE_PROVIDER_SITE_OTHER): Payer: Commercial Managed Care - PPO | Admitting: Obstetrics and Gynecology

## 2022-08-13 VITALS — BP 118/84 | HR 64 | Ht 67.0 in | Wt 121.0 lb

## 2022-08-13 DIAGNOSIS — N951 Menopausal and female climacteric states: Secondary | ICD-10-CM

## 2022-08-13 DIAGNOSIS — Z124 Encounter for screening for malignant neoplasm of cervix: Secondary | ICD-10-CM | POA: Diagnosis not present

## 2022-08-13 DIAGNOSIS — Z01419 Encounter for gynecological examination (general) (routine) without abnormal findings: Secondary | ICD-10-CM | POA: Diagnosis not present

## 2022-08-13 DIAGNOSIS — Z1211 Encounter for screening for malignant neoplasm of colon: Secondary | ICD-10-CM

## 2022-08-13 MED ORDER — ESTRADIOL 1 MG PO TABS
1.0000 mg | ORAL_TABLET | Freq: Every day | ORAL | 3 refills | Status: DC
  Filled 2022-08-13 – 2022-08-28 (×2): qty 90, 90d supply, fill #0
  Filled 2022-11-30: qty 90, 90d supply, fill #1
  Filled 2023-02-25: qty 90, 90d supply, fill #2
  Filled 2023-05-28: qty 90, 90d supply, fill #3

## 2022-08-13 MED ORDER — PROGESTERONE MICRONIZED 100 MG PO CAPS
100.0000 mg | ORAL_CAPSULE | Freq: Every day | ORAL | 3 refills | Status: DC
  Filled 2022-08-13 – 2022-08-20 (×2): qty 90, 90d supply, fill #0
  Filled 2022-11-18 – 2022-11-20 (×2): qty 90, 90d supply, fill #1
  Filled 2023-02-16: qty 90, 90d supply, fill #2
  Filled 2023-05-19: qty 90, 90d supply, fill #3

## 2022-08-13 NOTE — Patient Instructions (Signed)

## 2022-08-14 LAB — CYTOLOGY - PAP
Comment: NEGATIVE
Diagnosis: NEGATIVE
High risk HPV: NEGATIVE

## 2022-08-15 ENCOUNTER — Other Ambulatory Visit: Payer: Self-pay | Admitting: Obstetrics and Gynecology

## 2022-08-15 DIAGNOSIS — Z1231 Encounter for screening mammogram for malignant neoplasm of breast: Secondary | ICD-10-CM

## 2022-08-20 ENCOUNTER — Other Ambulatory Visit (HOSPITAL_COMMUNITY): Payer: Self-pay

## 2022-08-25 DIAGNOSIS — Z1211 Encounter for screening for malignant neoplasm of colon: Secondary | ICD-10-CM | POA: Diagnosis not present

## 2022-08-28 ENCOUNTER — Other Ambulatory Visit (HOSPITAL_COMMUNITY): Payer: Self-pay

## 2022-08-29 ENCOUNTER — Other Ambulatory Visit (HOSPITAL_COMMUNITY): Payer: Self-pay

## 2022-09-03 LAB — COLOGUARD: COLOGUARD: NEGATIVE

## 2022-10-06 ENCOUNTER — Ambulatory Visit
Admission: RE | Admit: 2022-10-06 | Discharge: 2022-10-06 | Disposition: A | Discharge status: Home / Self Care | Payer: Commercial Managed Care - PPO | Source: Ambulatory Visit | Attending: Obstetrics and Gynecology | Admitting: Obstetrics and Gynecology

## 2022-10-06 DIAGNOSIS — Z1231 Encounter for screening mammogram for malignant neoplasm of breast: Secondary | ICD-10-CM

## 2022-11-19 ENCOUNTER — Other Ambulatory Visit (HOSPITAL_COMMUNITY): Payer: Self-pay

## 2022-11-20 ENCOUNTER — Other Ambulatory Visit (HOSPITAL_COMMUNITY): Payer: Self-pay

## 2022-11-20 ENCOUNTER — Other Ambulatory Visit: Payer: Self-pay

## 2022-12-05 ENCOUNTER — Other Ambulatory Visit: Payer: Self-pay

## 2022-12-25 ENCOUNTER — Other Ambulatory Visit (HOSPITAL_COMMUNITY): Payer: Self-pay

## 2022-12-25 MED ORDER — AZITHROMYCIN 500 MG PO TABS
500.0000 mg | ORAL_TABLET | Freq: Every day | ORAL | 0 refills | Status: DC
  Filled 2022-12-25: qty 4, 4d supply, fill #0

## 2022-12-25 MED ORDER — ATOVAQUONE-PROGUANIL HCL 250-100 MG PO TABS
1.0000 | ORAL_TABLET | Freq: Every day | ORAL | 0 refills | Status: DC
  Filled 2022-12-25: qty 24, 24d supply, fill #0

## 2022-12-31 ENCOUNTER — Encounter: Payer: 59 | Admitting: Nurse Practitioner

## 2023-06-15 ENCOUNTER — Other Ambulatory Visit (HOSPITAL_BASED_OUTPATIENT_CLINIC_OR_DEPARTMENT_OTHER): Payer: Self-pay

## 2023-06-15 MED ORDER — FLULAVAL 0.5 ML IM SUSY
0.5000 mL | PREFILLED_SYRINGE | Freq: Once | INTRAMUSCULAR | 0 refills | Status: AC
  Filled 2023-06-15: qty 0.5, 1d supply, fill #0

## 2023-07-23 ENCOUNTER — Ambulatory Visit (INDEPENDENT_AMBULATORY_CARE_PROVIDER_SITE_OTHER): Payer: Commercial Managed Care - PPO | Admitting: Nurse Practitioner

## 2023-07-23 VITALS — BP 100/60 | HR 75 | Temp 98.6°F | Ht 67.0 in | Wt 119.0 lb

## 2023-07-23 DIAGNOSIS — Z2821 Immunization not carried out because of patient refusal: Secondary | ICD-10-CM | POA: Diagnosis not present

## 2023-07-23 DIAGNOSIS — Z0000 Encounter for general adult medical examination without abnormal findings: Secondary | ICD-10-CM

## 2023-07-23 DIAGNOSIS — E7800 Pure hypercholesterolemia, unspecified: Secondary | ICD-10-CM

## 2023-07-23 DIAGNOSIS — Z806 Family history of leukemia: Secondary | ICD-10-CM | POA: Diagnosis not present

## 2023-07-23 NOTE — Assessment & Plan Note (Signed)
She is planning to get done on a Monday at a pharmacy

## 2023-07-23 NOTE — Assessment & Plan Note (Signed)

## 2023-07-23 NOTE — Patient Instructions (Signed)
Health Maintenance  Topic Date Due   COVID-19 Vaccine (6 - 2024-25 season) 08/08/2023*   Mammogram  10/06/2024   Pap with HPV screening  08/14/2027   Colon Cancer Screening  09/08/2027   DTaP/Tdap/Td vaccine (4 - Td or Tdap) 12/20/2031   Flu Shot  Completed   Hepatitis C Screening  Completed   HIV Screening  Completed   Zoster (Shingles) Vaccine  Completed   HPV Vaccine  Aged Out  *Topic was postponed. The date shown is not the original due date.

## 2023-07-23 NOTE — Assessment & Plan Note (Signed)
Will be checking her CBC due to concerns with family history of Leukemia, her father passed at this age

## 2023-07-23 NOTE — Progress Notes (Signed)
Madelaine Bhat, CMA,acting as a Neurosurgeon for Arnette Felts, FNP.,have documented all relevant documentation on the behalf of Arnette Felts, FNP,as directed by  Arnette Felts, FNP while in the presence of Arnette Felts, FNP.  Subjective:    Patient ID: Marie Avila , female    DOB: [birth year] , 57 y.o.   MRN: [id number]  Chief Complaint  Patient presents with   Annual Exam    HPI  Patient presents today for HM, Patient reports compliance with medication. Patient denies any chest pain, SOB, or headaches. Patient has no concerns today.      Past Medical History:  Diagnosis Date   Allergy 10 years ago   seasonal   Heart murmur    low grade     Family History  Problem Relation Age of Onset   Hypertension Maternal Grandfather    Heart disease Maternal Grandfather    Stroke Maternal Grandfather    Cancer Father        leukemia   Other Mother        Altzheimers   Other Maternal Grandmother    Stroke Maternal Grandmother    Alzheimer's disease Paternal Grandmother    Cirrhosis Paternal Grandfather      Current Outpatient Medications:    estradiol (ESTRACE) 1 MG tablet, Take 1 tablet (1 mg total) by mouth daily., Disp: 90 tablet, Rfl: 3   progesterone (PROMETRIUM) 100 MG capsule, Take 1 capsule (100 mg total) by mouth daily., Disp: 90 capsule, Rfl: 3   Allergies  Allergen Reactions   Bee Venom    Penicillin G Hives   Penicillins       The patient states she uses post menopausal status for birth control. Patient's last menstrual period was 05/16/2018 (exact date).. Negative for Dysmenorrhea and Negative for Menorrhagia. Negative for: breast discharge, breast lump(s), breast pain and breast self exam. Associated symptoms include abnormal vaginal bleeding. Pertinent negatives include abnormal bleeding (hematology), anxiety, decreased libido, depression, difficulty falling sleep, dyspareunia, history of infertility, nocturia, sexual dysfunction, sleep disturbances, urinary  incontinence, urinary urgency, vaginal discharge and vaginal itching. Diet: Vegetarian, overall good. The patient states her exercise level is moderate with running 3 times a week and strength training 2 times a week.    . The patient's tobacco use is:  Social History   Tobacco Use  Smoking Status Never  Smokeless Tobacco Never  . She has been exposed to passive smoke. The patient's alcohol use is:  Social History   Substance and Sexual Activity  Alcohol Use Yes   Alcohol/week: 2.0 standard drinks of alcohol   Comment: 1-3 drinks/week   Additional information: she has her GYN care by Dr. Edward Jolly Last pap 08/13/2022, next one scheduled for 08/13/2025.    Review of Systems  Constitutional: Negative.   HENT: Negative.    Eyes: Negative.   Respiratory: Negative.    Cardiovascular: Negative.   Gastrointestinal: Negative.   Endocrine: Negative.   Genitourinary: Negative.   Musculoskeletal: Negative.   Skin: Negative.   Allergic/Immunologic: Negative.   Neurological: Negative.   Hematological: Negative.   Psychiatric/Behavioral: Negative.       Today's Vitals   07/23/23 1428  BP: 100/60  Pulse: 75  Temp: 98.6 F (37 C)  TempSrc: Oral  Weight: 119 lb (54 kg)  Height: 5\' 7"  (1.702 m)  PainSc: 0-No pain   Body mass index is 18.64 kg/m.  Wt Readings from Last 3 Encounters:  07/23/23 119 lb (54 kg)  08/13/22 121 lb (54.9 kg)  12/19/21 119 lb (54 kg)     Objective:  Physical Exam Vitals reviewed.  Constitutional:      General: She is not in acute distress.    Appearance: Normal appearance. She is well-developed.  HENT:     Head: Normocephalic and atraumatic.     Right Ear: Hearing, tympanic membrane, ear canal and external ear normal. There is no impacted cerumen.     Left Ear: Hearing, tympanic membrane, ear canal and external ear normal. There is no impacted cerumen.     Nose: Nose normal.     Mouth/Throat:     Mouth: Mucous membranes are moist.  Eyes:     General:  Lids are normal.     Extraocular Movements: Extraocular movements intact.     Conjunctiva/sclera: Conjunctivae normal.     Pupils: Pupils are equal, round, and reactive to light.     Funduscopic exam:    Right eye: No papilledema.        Left eye: No papilledema.  Neck:     Thyroid: No thyroid mass.     Vascular: No carotid bruit.  Cardiovascular:     Rate and Rhythm: Normal rate and regular rhythm.     Pulses: Normal pulses.     Heart sounds: Normal heart sounds. No murmur heard. Pulmonary:     Effort: Pulmonary effort is normal. No respiratory distress.     Breath sounds: Normal breath sounds. No wheezing.  Chest:     Chest wall: No mass.  Breasts:    Tanner Score is 5.     Right: Normal. No mass or tenderness.     Left: Normal. No mass or tenderness.  Abdominal:     General: Abdomen is flat. Bowel sounds are normal. There is no distension.     Palpations: Abdomen is soft.     Tenderness: There is no abdominal tenderness.  Genitourinary:    Comments: Deferred - followed by GYN Musculoskeletal:        General: No swelling or tenderness. Normal range of motion.     Cervical back: Full passive range of motion without pain, normal range of motion and neck supple. No tenderness.     Right lower leg: No edema.     Left lower leg: No edema.  Lymphadenopathy:     Upper Body:     Right upper body: No supraclavicular, axillary or pectoral adenopathy.     Left upper body: No supraclavicular, axillary or pectoral adenopathy.  Skin:    General: Skin is warm and dry.     Capillary Refill: Capillary refill takes less than 2 seconds.  Neurological:     General: No focal deficit present.     Mental Status: She is alert and oriented to person, place, and time.     Cranial Nerves: No cranial nerve deficit.     Sensory: No sensory deficit.     Motor: No weakness.  Psychiatric:        Mood and Affect: Mood normal.        Behavior: Behavior normal.        Thought Content: Thought  content normal.        Judgment: Judgment normal.         Assessment And Plan:     Encounter for annual health examination Assessment & Plan: Behavior modifications discussed and diet history reviewed.   Pt will continue to exercise regularly and modify diet with low GI, plant based foods and decrease intake of processed  foods.  Recommend intake of daily multivitamin, Vitamin D, and calcium.  Recommend mammogram and colonoscopy for preventive screenings, as well as recommend immunizations that include influenza, TDAP, and Shingles   Orders: -     CMP14+EGFR  COVID-19 vaccination declined Assessment & Plan: She is planning to get done on a Monday at a pharmacy   Elevated cholesterol Assessment & Plan: Had an elevated total cholesterol, was normal at last visit. Will recheck lipid panel.   Orders: -     CBC with Differential/Platelet -     Lipid panel  Family history of leukemia Assessment & Plan: Will be checking her CBC due to concerns with family history of Leukemia, her father passed at this age    Return for 1 year physical. Patient was given opportunity to ask questions. Patient verbalized understanding of the plan and was able to repeat key elements of the plan. All questions were answered to their satisfaction.   Arnette Felts, FNP  I, Arnette Felts, FNP, have reviewed all documentation for this visit. The documentation on 07/23/23 for the exam, diagnosis, procedures, and orders are all accurate and complete.

## 2023-07-23 NOTE — Assessment & Plan Note (Signed)
Had an elevated total cholesterol, was normal at last visit. Will recheck lipid panel.

## 2023-07-24 LAB — CBC WITH DIFFERENTIAL/PLATELET
Basophils Absolute: 0.1 10*3/uL (ref 0.0–0.2)
Basos: 1 %
EOS (ABSOLUTE): 0.1 10*3/uL (ref 0.0–0.4)
Eos: 2 %
Hematocrit: 39.9 % (ref 34.0–46.6)
Hemoglobin: 13.2 g/dL (ref 11.1–15.9)
Immature Grans (Abs): 0 10*3/uL (ref 0.0–0.1)
Immature Granulocytes: 0 %
Lymphocytes Absolute: 2 10*3/uL (ref 0.7–3.1)
Lymphs: 30 %
MCH: 32 pg (ref 26.6–33.0)
MCHC: 33.1 g/dL (ref 31.5–35.7)
MCV: 97 fL (ref 79–97)
Monocytes Absolute: 0.5 10*3/uL (ref 0.1–0.9)
Monocytes: 8 %
Neutrophils Absolute: 3.9 10*3/uL (ref 1.4–7.0)
Neutrophils: 59 %
Platelets: 243 10*3/uL (ref 150–450)
RBC: 4.12 x10E6/uL (ref 3.77–5.28)
RDW: 11.8 % (ref 11.7–15.4)
WBC: 6.5 10*3/uL (ref 3.4–10.8)

## 2023-07-24 LAB — CMP14+EGFR
ALT: 15 [IU]/L (ref 0–32)
AST: 19 [IU]/L (ref 0–40)
Albumin: 4.5 g/dL (ref 3.8–4.9)
Alkaline Phosphatase: 51 [IU]/L (ref 44–121)
BUN/Creatinine Ratio: 20 (ref 9–23)
BUN: 20 mg/dL (ref 6–24)
Bilirubin Total: 0.4 mg/dL (ref 0.0–1.2)
CO2: 24 mmol/L (ref 20–29)
Calcium: 9.6 mg/dL (ref 8.7–10.2)
Chloride: 102 mmol/L (ref 96–106)
Creatinine, Ser: 1.02 mg/dL — ABNORMAL HIGH (ref 0.57–1.00)
Globulin, Total: 2.6 g/dL (ref 1.5–4.5)
Glucose: 74 mg/dL (ref 70–99)
Potassium: 4.6 mmol/L (ref 3.5–5.2)
Sodium: 140 mmol/L (ref 134–144)
Total Protein: 7.1 g/dL (ref 6.0–8.5)
eGFR: 65 mL/min/{1.73_m2} (ref >59)

## 2023-07-24 LAB — LIPID PANEL
Chol/HDL Ratio: 1.6 {ratio} (ref 0.0–4.4)
Cholesterol, Total: 197 mg/dL (ref 100–199)
HDL: 123 mg/dL (ref >39)
LDL Chol Calc (NIH): 61 mg/dL (ref 0–99)
Triglycerides: 72 mg/dL (ref 0–149)
VLDL Cholesterol Cal: 13 mg/dL (ref 5–40)

## 2023-08-11 ENCOUNTER — Other Ambulatory Visit: Payer: Self-pay | Admitting: Obstetrics and Gynecology

## 2023-08-11 DIAGNOSIS — N951 Menopausal and female climacteric states: Secondary | ICD-10-CM

## 2023-08-13 ENCOUNTER — Other Ambulatory Visit (HOSPITAL_COMMUNITY): Payer: Self-pay

## 2023-08-13 MED ORDER — PROGESTERONE MICRONIZED 100 MG PO CAPS
100.0000 mg | ORAL_CAPSULE | Freq: Every day | ORAL | 0 refills | Status: DC
  Filled 2023-08-13: qty 90, 90d supply, fill #0

## 2023-08-13 NOTE — Telephone Encounter (Signed)
 Medication refill request: prometrium 100mg  Last AEX:  08-13-22 Next AEX: 10-15-23 Last MMG (if hormonal medication request): 10-06-22 birads 1:neg Refill authorized: please approve if appropriate

## 2023-08-22 ENCOUNTER — Other Ambulatory Visit: Payer: Self-pay | Admitting: Obstetrics and Gynecology

## 2023-08-24 NOTE — Telephone Encounter (Signed)
 Med refill request: Estrace Last AEX: 08/13/22 Next AEX: 10/15/23 Last MMG (if hormonal med) 10/06/22 Refill authorized: Please Advise, [phone], [phone] RF

## 2023-08-25 ENCOUNTER — Other Ambulatory Visit: Payer: Self-pay | Admitting: Obstetrics and Gynecology

## 2023-08-25 ENCOUNTER — Other Ambulatory Visit (HOSPITAL_COMMUNITY): Payer: Self-pay

## 2023-08-25 DIAGNOSIS — Z1231 Encounter for screening mammogram for malignant neoplasm of breast: Secondary | ICD-10-CM

## 2023-08-25 MED ORDER — ESTRADIOL 1 MG PO TABS
1.0000 mg | ORAL_TABLET | Freq: Every day | ORAL | 0 refills | Status: DC
  Filled 2023-08-25: qty 90, 90d supply, fill #0

## 2023-10-01 NOTE — Progress Notes (Signed)
 58 y.o. G0P0 Single Caucasian female here for annual exam.    Patient is on HRT. Sleeping better now.  Hot flashes are controlled overall.   Taking a break from work.   Does labs with PCP.   Same partner for 8 years.   Going to [place] with her sister.   PCP: Arnette Felts, FNP   Patient's last menstrual period was 05/16/2018 (exact date).           Sexually active: Yes.    The current method of family planning is post menopausal status.    Menopausal hormone therapy:  estrace, progesterone Exercising: Yes.     Running, weight training Smoker:  no  OB History  Gravida Para Term Preterm AB Living  0       SAB IAB Ectopic Multiple Live Births           HEALTH MAINTENANCE: Last 2 paps:  08/13/22 neg: HR HPV neg, 04/15/17 neg: HR HPV neg History of abnormal Pap or positive HPV:  no Mammogram:   10/09/23 Breast Density Cat C, BI-RADS CAT 1 neg Colonoscopy:  cologuard 2024 - negative.  Bone Density:  n/a  Result  n/a   Immunization History  Administered Date(s) Administered   Influenza, Seasonal, Injecte, Preservative Fre 06/15/2023   Influenza-Unspecified 04/13/2018   PFIZER(Purple Top)SARS-COV-2 Vaccination 08/25/2019, 09/15/2019, 06/22/2020   Pfizer Covid-19 Vaccine Bivalent Booster 20yrs & up 09/26/2021   Pfizer(Comirnaty)Fall Seasonal Vaccine 12 years and older 06/17/2022   Tdap 11/09/2000, 11/25/2011, 12/19/2021   Zoster Recombinant(Shingrix) 04/07/2019, 06/28/2019      reports that she has never smoked. She has never used smokeless tobacco. She reports current alcohol use of about 2.0 standard drinks of alcohol per week. She reports that she does not use drugs.  Past Medical History:  Diagnosis Date   Allergy 10 years ago   seasonal   Heart murmur    low grade    Past Surgical History:  Procedure Laterality Date   wart removal     from roof of mouth   WISDOM TOOTH EXTRACTION  age 53    Current Outpatient Medications  Medication Sig Dispense Refill    estradiol (ESTRACE) 1 MG tablet Take 1 tablet (1 mg total) by mouth daily. 90 tablet 0   progesterone (PROMETRIUM) 100 MG capsule Take 1 capsule (100 mg total) by mouth at bedtime. 90 capsule 0   No current facility-administered medications for this visit.    ALLERGIES: Bee venom, Penicillin g, and Penicillins  Family History  Problem Relation Age of Onset   Hypertension Maternal Grandfather    Heart disease Maternal Grandfather    Stroke Maternal Grandfather    Cancer Father        leukemia   Other Mother        Altzheimers   Other Maternal Grandmother    Stroke Maternal Grandmother    Alzheimer's disease Paternal Grandmother    Cirrhosis Paternal Grandfather     Review of Systems  All other systems reviewed and are negative.   PHYSICAL EXAM:  BP 124/84 (BP Location: Right Arm, Patient Position: Sitting, Cuff Size: Small)   Pulse 64   Ht 5' 7.5" (1.715 m)   Wt 121 lb (54.9 kg)   LMP 05/16/2018 (Exact Date)   SpO2 100%   BMI 18.67 kg/m     General appearance: alert, cooperative and appears stated age Head: normocephalic, without obvious abnormality, atraumatic Neck: no adenopathy, supple, symmetrical, trachea midline and thyroid normal to inspection  and palpation Lungs: clear to auscultation bilaterally Breasts: normal appearance, no masses or tenderness, No nipple retraction or dimpling, No nipple discharge or bleeding, No axillary adenopathy Heart: regular rate and rhythm Abdomen: soft, non-tender; no masses, no organomegaly Extremities: extremities normal, atraumatic, no cyanosis or edema Skin: skin color, texture, turgor normal. No rashes or lesions Lymph nodes: cervical, supraclavicular, and axillary nodes normal. Neurologic: grossly normal  Pelvic: External genitalia:  no lesions              No abnormal inguinal nodes palpated.              Urethra:  normal appearing urethra with no masses, tenderness or lesions              Bartholins and Skenes: normal                  Vagina: normal appearing vagina with normal color and discharge, no lesions              Cervix: no lesions              Pap taken: No. Bimanual Exam:  Uterus:  normal size, contour, position, consistency, mobility, non-tender              Adnexa: no mass, fullness, tenderness              Rectal exam: Yes.  .  Confirms.              Anus:  normal sphincter tone, no lesions  Chaperone was present for exam:  Warren Lacy, CMA  ASSESSMENT: Well woman visit with gynecologic exam. HRT. PHQ-9: 0  PLAN: Mammogram screening discussed. Self breast awareness reviewed. Pap and HRV collected:  no.  Due in 2029.  Guidelines for Calcium, Vitamin D, regular exercise program including cardiovascular and weight bearing exercise. Discused WHI and use of HRT which can increase risk of PE, DVT, MI, stroke and breast cancer.  Medication refills:  Estrace 1 mg daily and Prometrium 100 mg q hs for one year.  Follow up:  yearly and prn.

## 2023-10-09 ENCOUNTER — Ambulatory Visit
Admission: RE | Admit: 2023-10-09 | Discharge: 2023-10-09 | Disposition: A | Discharge status: Home / Self Care | Payer: 59 | Source: Ambulatory Visit | Attending: Obstetrics and Gynecology | Admitting: Obstetrics and Gynecology

## 2023-10-09 DIAGNOSIS — Z1231 Encounter for screening mammogram for malignant neoplasm of breast: Secondary | ICD-10-CM

## 2023-10-14 ENCOUNTER — Encounter: Payer: Self-pay | Admitting: Obstetrics and Gynecology

## 2023-10-15 ENCOUNTER — Encounter: Payer: Self-pay | Admitting: Obstetrics and Gynecology

## 2023-10-15 ENCOUNTER — Other Ambulatory Visit (HOSPITAL_COMMUNITY): Payer: Self-pay

## 2023-10-15 ENCOUNTER — Ambulatory Visit (INDEPENDENT_AMBULATORY_CARE_PROVIDER_SITE_OTHER): Payer: Commercial Managed Care - PPO | Admitting: Obstetrics and Gynecology

## 2023-10-15 VITALS — BP 124/84 | HR 64 | Ht 67.5 in | Wt 121.0 lb

## 2023-10-15 DIAGNOSIS — Z1331 Encounter for screening for depression: Secondary | ICD-10-CM | POA: Diagnosis not present

## 2023-10-15 DIAGNOSIS — N951 Menopausal and female climacteric states: Secondary | ICD-10-CM | POA: Diagnosis not present

## 2023-10-15 DIAGNOSIS — Z01419 Encounter for gynecological examination (general) (routine) without abnormal findings: Secondary | ICD-10-CM

## 2023-10-15 MED ORDER — PROGESTERONE MICRONIZED 100 MG PO CAPS
100.0000 mg | ORAL_CAPSULE | Freq: Every day | ORAL | 3 refills | Status: AC
  Filled 2023-10-15 – 2023-11-11 (×2): qty 90, 90d supply, fill #0
  Filled 2024-02-05: qty 90, 90d supply, fill #1
  Filled 2024-05-05: qty 90, 90d supply, fill #2
  Filled 2024-08-12: qty 90, 90d supply, fill #3

## 2023-10-15 MED ORDER — ESTRADIOL 1 MG PO TABS
1.0000 mg | ORAL_TABLET | Freq: Every day | ORAL | 3 refills | Status: AC
  Filled 2023-10-15 – 2023-11-22 (×2): qty 90, 90d supply, fill #0
  Filled 2024-02-19: qty 90, 90d supply, fill #1
  Filled 2024-05-19: qty 90, 90d supply, fill #2
  Filled 2024-08-12: qty 90, 90d supply, fill #3

## 2023-10-15 NOTE — Patient Instructions (Addendum)
 Calcium in Foods Calcium is a mineral in the body. Of all the minerals in your body, you have the most calcium. Most of the body's calcium supply is stored in bones and teeth. Calcium helps many parts of the body work, including: Blood and blood vessels. Nerves. Hormones. Muscles. Bones and teeth. When your calcium stores are low, you may be at risk for low bone mass, bone loss, and broken bones. When you get enough calcium, it helps to support strong bones and teeth throughout your life. Calcium is especially important for: Children during growth spurts. Females during adolescence. Females who are pregnant or breastfeeding. Females after their menstrual cycle stops (postmenopausal). Females whose menstrual cycle has stopped because of an eating disorder or regular intense exercise. People who can't eat or digest dairy products. People who eat a vegan diet. Recommended daily amounts of calcium: Females (ages 87 to 55): 1,000 mg per day. Females (ages 35 and older): 1,200 mg per day. Males (ages 53 to 45): 1,000 mg per day. Males (ages 43 and older): 1,200 mg per day. Females (ages 88 to 28): 1,300 mg per day. Males (ages 41 to 56): 1,300 mg per day. General information Eat foods that are high in calcium. Try to get most of your calcium from food. Some people may benefit from taking calcium supplements. Check with your health care provider or an expert in healthy eating called a dietitian before starting any calcium supplements. Calcium supplements may interact with certain medicines. Too much calcium may cause other health problems, such as trouble pooping and kidney stones. For the body to absorb calcium, it needs vitamin D. Sources of vitamin D include: Skin exposure to direct sunlight. Foods, such as egg yolks, liver, mushrooms, saltwater fish, and fortified milk. Vitamin D supplements. Check with your provider or dietitian before starting any vitamin D supplements. The amount of  calcium that is absorbed in the body varies with type of food. Talk to a dietitian about what foods are best for you, especially if you are eat a vegan diet or don't eat dairy. What foods are high in calcium?  Foods that are high in calcium contain more than 100 milligrams per serving. Fruits Fortified orange juice or other fruit juice, 300 mg per 8 oz (237 mL) serving. Vegetables Collard greens, 260 mg per 1 cup (130 g) serving, cooked. Kale, 180 mg per 1 cup (118 g) serving, cooked. Bok choy, 180 mg per 1 cup (170 g) serving, cooked Grains Fortified frozen waffles, 200 mg in 2 waffles. Oatmeal, 180 mg in 1 cup (234 g) serving, cooked. Fortified white bread, 175 mg per slice. Meats and other proteins Sardines, canned with bones, 350 mg per 3.75 oz (92 g) serving. Salmon, canned with bones, 168 mg per 3 oz (85 g) serving. Canned shrimp, 125 mg per 3 oz (85 g) serving. Baked beans, 120 mg per 1 cup (266 g) serving. Tofu, firm, made with calcium sulfate, 861 mg per  cup (126 g) serving. Dairy Yogurt, plain, low-fat, 448 mg per 1 cup (245 g) serving Nonfat milk, 300 mg per 1 cup (245 g) serving. American cheese, 145 mg per 1 oz (21 g) serving or 1 slice. Cheddar cheese, 200 mg per 1 oz (28 g) serving or 1 slice. Cottage cheese 2%, 125 mg per  cup (113 g) serving. Fortified soy, rice, or almond milk, 300 mg per 1 cup (237 mL) serving. Mozzarella, part skim, 210 mg per 1 oz (21 g) serving. The items listed  above may not be a complete list of foods high in calcium. Actual amounts of calcium may be different depending on processing. Contact a dietitian for more information. What foods are lower in calcium? Foods that are lower in calcium contain 50 mg or less per serving. Fruits Apple, 1 medium, about 6 mg. Banana, 1 medium, about 12 mg. Vegetables Lettuce, 19 mg per 1 cup (35 g) serving. Tomato, 1 small, about 11 mg. Grains Rice, white, 8 mg per  cup (79 g) serving. Boiled  potatoes, 14 mg per 1 cup (160 g) serving. White bread, 6 mg per slice. Meats and other proteins Egg, 24 mg per 1 egg (50 g). Red meat, 7 mg per 4 oz (80 g) serving. Chicken, 17 mg per 4 oz (113 g) serving. Fish, cod, or trout, 20 mg per 4 oz (140 g) serving. Dairy Cream cheese, regular, 14 mg per 1 Tbsp (15 g) serving. Brie cheese, 50 mg per 1 oz (32 g) serving. The items listed above may not be a complete list of foods lower in calcium. Actual amounts of calcium may be different depending on processing. Contact a dietitian for more information. This information is not intended to replace advice given to you by your health care provider. Make sure you discuss any questions you have with your health care provider. Document Revised: 04/25/2023 Document Reviewed: 04/25/2023 [employer] Patient Education  2024 [employer]  EXERCISE AND DIET:  We recommended that you start or continue a regular exercise program for good health. Regular exercise means any activity that makes your heart beat faster and makes you sweat.  We recommend exercising at least 30 minutes per day at least 3 days a week, preferably 4 or 5.  We also recommend a diet low in fat and sugar.  Inactivity, poor dietary choices and obesity can cause diabetes, heart attack, stroke, and kidney damage, among others.    ALCOHOL AND SMOKING:  Women should limit their alcohol intake to no more than 7 drinks/beers/glasses of wine (combined, not each!) per week. Moderation of alcohol intake to this level decreases your risk of breast cancer and liver damage. And of course, no recreational drugs are part of a healthy lifestyle.  And absolutely no smoking or even second hand smoke. Most people know smoking can cause heart and lung diseases, but did you know it also contributes to weakening of your bones? Aging of your skin?  Yellowing of your teeth and nails?  CALCIUM AND VITAMIN D:  Adequate intake of calcium and Vitamin D are recommended.  The  recommendations for exact amounts of these supplements seem to change often, but generally speaking 600 mg of calcium (either carbonate or citrate) and 800 units of Vitamin D per day seems prudent. Certain women may benefit from higher intake of Vitamin D.  If you are among these women, your doctor will have told you during your visit.    PAP SMEARS:  Pap smears, to check for cervical cancer or precancers,  have traditionally been done yearly, although recent scientific advances have shown that most women can have pap smears less often.  However, every woman still should have a physical exam from her gynecologist every year. It will include a breast check, inspection of the vulva and vagina to check for abnormal growths or skin changes, a visual exam of the cervix, and then an exam to evaluate the size and shape of the uterus and ovaries.  And after 57 years of age, a rectal exam  is indicated to check for rectal cancers. We will also provide age appropriate advice regarding health maintenance, like when you should have certain vaccines, screening for sexually transmitted diseases, bone density testing, colonoscopy, mammograms, etc.   MAMMOGRAMS:  All women over 11 years old should have a yearly mammogram. Many facilities now offer a "3D" mammogram, which may cost around $50 extra out of pocket. If possible,  we recommend you accept the option to have the 3D mammogram performed.  It both reduces the number of women who will be called back for extra views which then turn out to be normal, and it is better than the routine mammogram at detecting truly abnormal areas.    COLONOSCOPY:  Colonoscopy to screen for colon cancer is recommended for all women at age 8.  We know, you hate the idea of the prep.  We agree, BUT, having colon cancer and not knowing it is worse!!  Colon cancer so often starts as a polyp that can be seen and removed at colonscopy, which can quite literally save your life!  And if your first  colonoscopy is normal and you have no family history of colon cancer, most women don't have to have it again for 10 years.  Once every ten years, you can do something that may end up saving your life, right?  We will be happy to help you get it scheduled when you are ready.  Be sure to check your insurance coverage so you understand how much it will cost.  It may be covered as a preventative service at no cost, but you should check your particular policy.

## 2023-11-11 ENCOUNTER — Other Ambulatory Visit (HOSPITAL_COMMUNITY): Payer: Self-pay

## 2023-11-23 ENCOUNTER — Other Ambulatory Visit (HOSPITAL_COMMUNITY): Payer: Self-pay

## 2023-12-11 ENCOUNTER — Other Ambulatory Visit (HOSPITAL_COMMUNITY): Payer: Self-pay

## 2024-07-27 ENCOUNTER — Encounter: Payer: Commercial Managed Care - PPO | Admitting: Nurse Practitioner

## 2024-08-12 ENCOUNTER — Other Ambulatory Visit (HOSPITAL_COMMUNITY): Payer: Self-pay

## 2024-08-15 ENCOUNTER — Other Ambulatory Visit (HOSPITAL_COMMUNITY): Payer: Self-pay

## 2024-10-18 ENCOUNTER — Ambulatory Visit: Admitting: Obstetrics and Gynecology
